# Patient Record
Sex: Female | Born: 1990 | Race: Black or African American | Hispanic: No | Marital: Single | Smoking: Former smoker
Health system: Southern US, Community
[De-identification: ages and names within clinical notes are randomized; demographics above are authoritative.]

## PROBLEM LIST (undated history)

## (undated) DIAGNOSIS — I10 Essential (primary) hypertension: Secondary | ICD-10-CM

## (undated) DIAGNOSIS — I82409 Acute embolism and thrombosis of unspecified deep veins of unspecified lower extremity: Secondary | ICD-10-CM

## (undated) DIAGNOSIS — O169 Unspecified maternal hypertension, unspecified trimester: Secondary | ICD-10-CM

## (undated) HISTORY — PX: ANKLE FRACTURE SURGERY: SHX122

## (undated) HISTORY — PX: DENTAL SURGERY: SHX609

## (undated) HISTORY — PX: DILATION AND CURETTAGE OF UTERUS: SHX78

---
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
## 2010-08-18 LAB — DIFFERENTIAL
Basophils Absolute: 0 10*3/uL (ref 0.0–0.1)
Lymphocytes Relative: 33 % (ref 12–46)
Lymphs Abs: 1.2 10*3/uL (ref 0.7–4.0)
Monocytes Absolute: 0.3 10*3/uL (ref 0.1–1.0)
Neutro Abs: 2.1 10*3/uL (ref 1.7–7.7)
Neutrophils Relative %: 58 % (ref 43–77)

## 2010-08-18 LAB — CBC
HCT: 39.6 % (ref 36.0–46.0)
RBC: 4.59 MIL/uL (ref 3.87–5.11)
RDW: 12.5 % (ref 11.5–15.5)

## 2010-08-18 LAB — URINE MICROSCOPIC-ADD ON

## 2010-08-18 LAB — URINALYSIS, ROUTINE W REFLEX MICROSCOPIC
Ketones, ur: 15 mg/dL — AB
Protein, ur: 30 mg/dL — AB

## 2010-08-18 LAB — BASIC METABOLIC PANEL
CO2: 28 mEq/L (ref 19–32)
Potassium: 3.9 mEq/L (ref 3.5–5.1)
Sodium: 143 mEq/L (ref 135–145)

## 2010-08-18 LAB — PREGNANCY, URINE: Preg Test, Ur: NEGATIVE

## 2011-01-28 ENCOUNTER — Emergency Department (HOSPITAL_BASED_OUTPATIENT_CLINIC_OR_DEPARTMENT_OTHER)
Admission: EM | Admit: 2011-01-28 | Discharge: 2011-01-28 | Disposition: A | Discharge status: Home / Self Care | Payer: Self-pay | Attending: Emergency Medicine | Admitting: Emergency Medicine

## 2011-01-28 ENCOUNTER — Encounter: Payer: Self-pay | Admitting: *Deleted

## 2011-01-28 DIAGNOSIS — R5381 Other malaise: Secondary | ICD-10-CM | POA: Insufficient documentation

## 2011-01-28 DIAGNOSIS — R531 Weakness: Secondary | ICD-10-CM

## 2011-01-28 DIAGNOSIS — R609 Edema, unspecified: Secondary | ICD-10-CM | POA: Insufficient documentation

## 2011-01-28 LAB — URINALYSIS, ROUTINE W REFLEX MICROSCOPIC
Glucose, UA: NEGATIVE mg/dL
Ketones, ur: 15 mg/dL — AB
Nitrite: NEGATIVE
Specific Gravity, Urine: 1.035 — ABNORMAL HIGH (ref 1.005–1.030)

## 2011-01-28 LAB — COMPREHENSIVE METABOLIC PANEL
Albumin: 4 g/dL (ref 3.5–5.2)
Calcium: 9.3 mg/dL (ref 8.4–10.5)
GFR calc non Af Amer: >60 mL/min (ref >60)
Sodium: 140 mEq/L (ref 135–145)
Total Bilirubin: 0.4 mg/dL (ref 0.3–1.2)

## 2011-01-28 LAB — CBC
HCT: 37.2 % (ref 36.0–46.0)
Hemoglobin: 12.9 g/dL (ref 12.0–15.0)
MCH: 28.5 pg (ref 26.0–34.0)
MCHC: 34.7 g/dL (ref 30.0–36.0)
MCV: 82.3 fL (ref 78.0–100.0)
RBC: 4.52 MIL/uL (ref 3.87–5.11)
RDW: 12.5 % (ref 11.5–15.5)

## 2011-01-28 LAB — DIFFERENTIAL
Basophils Absolute: 0 10*3/uL (ref 0.0–0.1)
Eosinophils Absolute: 0 10*3/uL (ref 0.0–0.7)
Neutrophils Relative %: 81 % — ABNORMAL HIGH (ref 43–77)

## 2011-01-28 LAB — PREGNANCY, URINE: Preg Test, Ur: NEGATIVE

## 2011-01-28 MED ORDER — HYDROCHLOROTHIAZIDE 25 MG PO TABS: 12.5000 mg | ORAL_TABLET | ORAL | Status: DC | PRN

## 2011-01-28 MED ORDER — ONDANSETRON 8 MG PO TBDP
8.0000 mg | ORAL_TABLET | Freq: Once | ORAL | Status: AC
  Administered 2011-01-28: 8 mg via ORAL

## 2011-01-28 MED ORDER — IBUPROFEN 200 MG PO TABS
600.0000 mg | ORAL_TABLET | Freq: Once | ORAL | Status: DC
  Filled 2011-01-28: qty 1

## 2011-01-28 MED ORDER — ONDANSETRON 8 MG PO TBDP
ORAL_TABLET | ORAL | Status: AC
  Filled 2011-01-28: qty 1

## 2011-01-28 MED ORDER — KETOROLAC TROMETHAMINE 60 MG/2ML IM SOLN
60.0000 mg | Freq: Once | INTRAMUSCULAR | Status: AC
  Administered 2011-01-28: 60 mg via INTRAMUSCULAR
  Filled 2011-01-28: qty 2

## 2011-01-28 NOTE — ED Notes (Signed)
Pt states that she has noticed her feet swelling. Felt weak at work. "Head spinning". Vaginal bleeding now. Unusual for second month. Spotted before last period.

## 2011-01-28 NOTE — ED Provider Notes (Signed)
Scribed for Dr. Judd Lien, the patient was seen in room 11. This chart was scribed by Hillery Hunter. This patient's care was started at 16:40.   History   CSN: [id number] Arrival date & time: 01/28/2011  4:06 PM  Chief Complaint  Patient presents with  . Weakness   The history is provided by the patient.    Jody Henson is a 20 y.o. female who presents to the Emergency Department complaining of feeling unwell while at work today with generalized weakness, cramping abdominal pain with current menstruation, and feet swelling that waxes and wanes chronically. She has been worked up for the feet swelling without definitive diagnosis and denies any history of trauma to the area. She denies dysuria, frequency, fever, sore throat, cough. She works at [employer] as a Nature conservation officer and reports otherwise normal health history without prior diagnosis or surgery.   History reviewed. No pertinent past medical history.  History reviewed. No pertinent past surgical history.  History reviewed. No pertinent family history.   Review of Systems  Constitutional: Negative for fever.  HENT: Negative for neck stiffness.   Eyes: Negative for visual disturbance.  Respiratory: Negative for cough and shortness of breath.   Cardiovascular: Positive for leg swelling (waxing and waning).       Denies calf pain  Gastrointestinal: Positive for abdominal pain (cramping with menses) and diarrhea (two days ago loose stool). Negative for blood in stool.  Genitourinary: Positive for vaginal bleeding (current menses). Negative for dysuria, frequency, vaginal discharge and difficulty urinating.  Musculoskeletal: Negative for back pain and gait problem.  Skin: Negative for color change.  Neurological: Negative for speech difficulty and headaches.  Psychiatric/Behavioral: Negative for confusion.    Physical Exam  BP 138/93  Pulse 80  Temp(Src) 98.3 F (36.8 C) (Oral)  Resp 18  Ht 5\' 6"  (1.676 m)  Wt 182 lb (82.555  kg)  BMI 29.38 kg/m2  SpO2 100%  LMP 01/28/2011  Physical Exam  Nursing note and vitals reviewed. Constitutional:       Awake, alert, nontoxic appearance with baseline speech for patient.  HENT:  Head: Atraumatic.  Mouth/Throat: No oropharyngeal exudate.  Eyes: Right eye exhibits no discharge. Left eye exhibits no discharge.  Neck: Neck supple.  Cardiovascular: Normal rate and regular rhythm.   No murmur heard. Pulmonary/Chest: Effort normal and breath sounds normal. No stridor. No respiratory distress. She has no wheezes. She has no rales.  Abdominal: Soft. Bowel sounds are normal. She exhibits no mass. There is no tenderness. There is no rebound.  Musculoskeletal: She exhibits no tenderness.       Baseline ROM, moves extremities with no obvious new focal weakness.  Lymphadenopathy:    She has no cervical adenopathy.  Neurological:       Awake, alert, cooperative and aware of situation  Skin: No rash noted.  Psychiatric: She has a normal mood and affect.    ED Course  Procedures   DIAGNOSTIC STUDIES: Oxygen Saturation is 100% on room air, normal by my interpretation.    LABS / RADIOLOGY:  Results for orders placed during the hospital encounter of 01/28/11  URINALYSIS, ROUTINE W REFLEX MICROSCOPIC      Component Value Range   Color, Urine AMBER (*) YELLOW    Appearance TURBID (*) CLEAR    Specific Gravity, Urine 1.035 (*) 1.005 - 1.030    pH 5.0  5.0 - 8.0    Glucose, UA NEGATIVE  NEGATIVE (mg/dL)   Hgb urine dipstick LARGE (*) NEGATIVE  Bilirubin Urine SMALL (*) NEGATIVE    Ketones, ur 15 (*) NEGATIVE (mg/dL)   Protein, ur 161 (*) NEGATIVE (mg/dL)   Urobilinogen, UA 1.0  0.0 - 1.0 (mg/dL)   Nitrite NEGATIVE  NEGATIVE    Leukocytes, UA SMALL (*) NEGATIVE   PREGNANCY, URINE      Component Value Range   Preg Test, Ur NEGATIVE    CBC      Component Value Range   WBC 6.4  4.0 - 10.5 (K/uL)   RBC 4.52  3.87 - 5.11 (MIL/uL)   Hemoglobin 12.9  12.0 - 15.0 (g/dL)    HCT 09.6  04.5 - 40.9 (%)   MCV 82.3  78.0 - 100.0 (fL)   MCH 28.5  26.0 - 34.0 (pg)   MCHC 34.7  30.0 - 36.0 (g/dL)   RDW 81.1  91.4 - 78.2 (%)   Platelets 227  150 - 400 (K/uL)  DIFFERENTIAL      Component Value Range   Neutrophils Relative 81 (*) 43 - 77 (%)   Neutro Abs 5.2  1.7 - 7.7 (K/uL)   Lymphocytes Relative 13  12 - 46 (%)   Lymphs Abs 0.8  0.7 - 4.0 (K/uL)   Monocytes Relative 6  3 - 12 (%)   Monocytes Absolute 0.4  0.1 - 1.0 (K/uL)   Eosinophils Relative 0  0 - 5 (%)   Eosinophils Absolute 0.0  0.0 - 0.7 (K/uL)   Basophils Relative 0  0 - 1 (%)   Basophils Absolute 0.0  0.0 - 0.1 (K/uL)  COMPREHENSIVE METABOLIC PANEL      Component Value Range   Sodium 140  135 - 145 (mEq/L)   Potassium 3.5  3.5 - 5.1 (mEq/L)   Chloride 104  96 - 112 (mEq/L)   CO2 26  19 - 32 (mEq/L)   Glucose, Bld 99  70 - 99 (mg/dL)   BUN 11  6 - 23 (mg/dL)   Creatinine, Ser 9.56  0.50 - 1.10 (mg/dL)   Calcium 9.3  8.4 - 21.3 (mg/dL)   Total Protein 7.9  6.0 - 8.3 (g/dL)   Albumin 4.0  3.5 - 5.2 (g/dL)   AST 17  0 - 37 (U/L)   ALT 10  0 - 35 (U/L)   Alkaline Phosphatase 77  39 - 117 (U/L)   Total Bilirubin 0.4  0.3 - 1.2 (mg/dL)   GFR calc non Af Amer >60  >60 (mL/min)   GFR calc Af Amer >60  >60 (mL/min)  URINE MICROSCOPIC-ADD ON      Component Value Range   Squamous Epithelial / LPF MANY (*) RARE    WBC, UA 11-20  <3 (WBC/hpf)   RBC / HPF TOO NUMEROUS TO COUNT  <3 (RBC/hpf)   Bacteria, UA MANY (*) RARE       ED COURSE / COORDINATION OF CARE: 16:52. Ordered CBC, CMP, U/A, urine pregnancy. 18:00. Patient requesting pain medication. Ordered Ibuprofen 600mg  PO. 18:45. Patient requesting antiemetic and additional pain medication. Ordered Zofran 8mg  ODT and Toradol 60mg  IM.  MDM: Labs okay.  Will discharge with hctz prn, follow up if worsens.   IMPRESSION: Diagnoses that have been ruled out:  Diagnoses that are still under consideration:  Final diagnoses:    PLAN:  Discharge  home I have reviewed the discharge instructions with the patient  CONDITION ON DISCHARGE: Stable  MEDICATIONS GIVEN IN THE E.D.  Medications  ondansetron (ZOFRAN-ODT) 8 MG disintegrating tablet (not administered)  ketorolac (TORADOL)  injection 60 mg (not administered)  ibuprofen (ADVIL,MOTRIN) tablet 600 mg (600 mg Oral Given 01/28/11 1815)  ondansetron (ZOFRAN-ODT) disintegrating tablet 8 mg (8 mg Oral Given 01/28/11 1837)    SCRIBE ATTESTATION: I personally performed the services described in this documentation, which was scribed in my presence. The recorded information has been reviewed and considered. Geoffery Lyons, MD        Geoffery Lyons, MD 01/28/11 1900

## 2011-06-10 ENCOUNTER — Encounter (HOSPITAL_BASED_OUTPATIENT_CLINIC_OR_DEPARTMENT_OTHER): Payer: Self-pay | Admitting: *Deleted

## 2011-06-10 ENCOUNTER — Emergency Department (HOSPITAL_BASED_OUTPATIENT_CLINIC_OR_DEPARTMENT_OTHER)
Admission: EM | Admit: 2011-06-10 | Discharge: 2011-06-10 | Disposition: A | Discharge status: Home / Self Care | Payer: Medicaid Other | Attending: Emergency Medicine | Admitting: Emergency Medicine

## 2011-06-10 DIAGNOSIS — N390 Urinary tract infection, site not specified: Secondary | ICD-10-CM | POA: Insufficient documentation

## 2011-06-10 DIAGNOSIS — B9689 Other specified bacterial agents as the cause of diseases classified elsewhere: Secondary | ICD-10-CM

## 2011-06-10 DIAGNOSIS — O2390 Unspecified genitourinary tract infection in pregnancy, unspecified trimester: Secondary | ICD-10-CM | POA: Insufficient documentation

## 2011-06-10 DIAGNOSIS — O2340 Unspecified infection of urinary tract in pregnancy, unspecified trimester: Secondary | ICD-10-CM

## 2011-06-10 LAB — URINE MICROSCOPIC-ADD ON

## 2011-06-10 LAB — URINALYSIS, ROUTINE W REFLEX MICROSCOPIC
Urobilinogen, UA: 0.2 mg/dL (ref 0.0–1.0)
pH: 7 (ref 5.0–8.0)

## 2011-06-10 LAB — WET PREP, GENITAL

## 2011-06-10 MED ORDER — METRONIDAZOLE 500 MG PO TABS: 500.0000 mg | ORAL_TABLET | Freq: Two times a day (BID) | ORAL | Status: AC

## 2011-06-10 MED ORDER — NITROFURANTOIN MONOHYD MACRO 100 MG PO CAPS: 100.0000 mg | ORAL_CAPSULE | Freq: Two times a day (BID) | ORAL | Status: AC

## 2011-06-10 NOTE — ED Notes (Signed)
Fetal HR detected by ultrasound 145 BPM

## 2011-06-10 NOTE — ED Notes (Signed)
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

## 2011-06-10 NOTE — ED Provider Notes (Signed)
History     CSN: [id number]  Arrival date & time 06/10/11  1044   First MD Initiated Contact with Patient 06/10/11 1204      Chief Complaint  Patient presents with  . Abdominal Cramping    (Consider location/radiation/quality/duration/timing/severity/associated sxs/prior treatment) HPI Comments: Pt states that she had intermittent abdominal cramping with lifting heavy items at work:pt denies any cramping at this time:pt denies fever:pt states that she is supposed to see [facility] ob on Jan 30, but she just got her medicaid so she thinks they will see her soon:pt state states that she has not had any care:pt state that she is [redacted] weeks pregnant and this is her first pregnancy  Patient is a 21 y.o. female presenting with cramps. The history is provided by the patient. No language interpreter was used.  Abdominal Cramping The primary symptoms of the illness include abdominal pain. The primary symptoms of the illness do not include nausea, vomiting, dysuria or vaginal discharge. The current episode started yesterday. The onset of the illness was sudden. The problem has been resolved.  The patient states that she believes she is currently pregnant. The patient has not had a change in bowel habit. Symptoms associated with the illness do not include constipation, frequency or back pain.    History reviewed. No pertinent past medical history.  History reviewed. No pertinent past surgical history.  No family history on file.  History  Substance Use Topics  . Smoking status: Never Smoker   . Smokeless tobacco: Not on file  . Alcohol Use: No    OB History    Grav Para Term Preterm Abortions TAB SAB Ect Mult Living   1 0 0  0           Review of Systems  Gastrointestinal: Positive for abdominal pain. Negative for nausea, vomiting and constipation.  Genitourinary: Negative for dysuria, frequency and vaginal discharge.  Musculoskeletal: Negative for back pain.  All other systems reviewed and  are negative.    Allergies  Review of patient's allergies indicates no known allergies.  Home Medications   Current Outpatient Rx  Name Route Sig Dispense Refill  . HYDROCHLOROTHIAZIDE 25 MG PO TABS Oral Take 0.5 tablets (12.5 mg total) by mouth as needed. 15 tablet 0    BP 115/66  Pulse 80  Temp(Src) 97.6 F (36.4 C) (Oral)  Resp 18  SpO2 100%  LMP 01/28/2011  Physical Exam  Nursing note and vitals reviewed. Constitutional: She is oriented to person, place, and time. She appears well-developed and well-nourished.  HENT:  Head: Normocephalic and atraumatic.  Cardiovascular: Normal rate and regular rhythm.   Pulmonary/Chest: Effort normal and breath sounds normal.  Abdominal: Soft. There is no tenderness.       gravid  Genitourinary:       White vaginal discharge:no cmt  Musculoskeletal: Normal range of motion.  Neurological: She is alert and oriented to person, place, and time.  Skin: Skin is warm and dry.  Psychiatric: She has a normal mood and affect.    ED Course  Procedures (including critical care time)  Labs Reviewed  URINALYSIS, ROUTINE W REFLEX MICROSCOPIC - Abnormal; Notable for the following:    APPearance CLOUDY (*)    Leukocytes, UA LARGE (*)    All other components within normal limits  URINE MICROSCOPIC-ADD ON - Abnormal; Notable for the following:    Squamous Epithelial / LPF MANY (*)    Bacteria, UA MANY (*)    All other components  within normal limits  WET PREP, GENITAL - Abnormal; Notable for the following:    Clue Cells, Wet Prep MANY (*)    WBC, Wet Prep HPF POC TOO NUMEROUS TO COUNT (*)    All other components within normal limits  PREGNANCY, URINE  GC/CHLAMYDIA PROBE AMP, GENITAL   No results found.   1. UTI in pregnancy   2. BV (bacterial vaginosis)       MDM  Pt fht about 145:abdomen is benign:will treat for uti and pt can follow up with AO:ZHYQ note pt to have lifting restriction        Teressa Lower, NP 06/10/11  1333

## 2011-06-10 NOTE — ED Provider Notes (Signed)
Medical screening examination/treatment/procedure(s) were performed by non-physician practitioner and as supervising physician I was immediately available for consultation/collaboration.  Ethelda Chick, MD 06/10/11 1343

## 2011-06-10 NOTE — ED Notes (Signed)
No cramping or abd pain at this time, abd soft, non-tender, fetal heart beat present

## 2011-06-13 LAB — GC/CHLAMYDIA PROBE AMP, GENITAL
Chlamydia, DNA Probe: POSITIVE — AB
GC Probe Amp, Genital: NEGATIVE

## 2011-06-14 NOTE — ED Notes (Signed)
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 Chart sent to EDP office for review.

## 2011-06-15 ENCOUNTER — Telehealth (HOSPITAL_COMMUNITY): Payer: Self-pay | Admitting: *Deleted

## 2011-06-15 NOTE — ED Notes (Signed)
RX for Doxycycline 100mg  BID 7 days  Written by Trixie Dredge Needs to be called  To pharmacy

## 2011-06-16 ENCOUNTER — Telehealth (HOSPITAL_COMMUNITY): Payer: Self-pay | Admitting: *Deleted

## 2011-06-17 ENCOUNTER — Telehealth (HOSPITAL_COMMUNITY): Payer: Self-pay | Admitting: Emergency Medicine

## 2011-06-17 NOTE — ED Notes (Addendum)
Several attempts made to contact patient; will send letter to Epic address

## 2011-06-19 NOTE — ED Notes (Signed)
Sent letter to Epic address - 1/21

## 2011-06-30 NOTE — ED Notes (Addendum)
Rx called to [facility] [phone]. Patient called back  And requests something in Liquid form.

## 2011-07-01 NOTE — ED Notes (Signed)
Patient contacted regarding new RX. RX for Amoxicillin 250mg /71ml, 10 ml PO TID x seven days called to [facility].

## 2011-07-17 ENCOUNTER — Encounter (HOSPITAL_BASED_OUTPATIENT_CLINIC_OR_DEPARTMENT_OTHER): Payer: Self-pay | Admitting: *Deleted

## 2011-07-17 ENCOUNTER — Emergency Department (INDEPENDENT_AMBULATORY_CARE_PROVIDER_SITE_OTHER): Payer: Medicaid Other

## 2011-07-17 ENCOUNTER — Emergency Department (HOSPITAL_BASED_OUTPATIENT_CLINIC_OR_DEPARTMENT_OTHER)
Admission: EM | Admit: 2011-07-17 | Discharge: 2011-07-17 | Disposition: A | Discharge status: Home Health | Payer: Medicaid Other | Attending: Emergency Medicine | Admitting: Emergency Medicine

## 2011-07-17 DIAGNOSIS — R600 Localized edema: Secondary | ICD-10-CM

## 2011-07-17 DIAGNOSIS — O2690 Pregnancy related conditions, unspecified, unspecified trimester: Secondary | ICD-10-CM | POA: Insufficient documentation

## 2011-07-17 DIAGNOSIS — M7989 Other specified soft tissue disorders: Secondary | ICD-10-CM

## 2011-07-17 DIAGNOSIS — R609 Edema, unspecified: Secondary | ICD-10-CM | POA: Insufficient documentation

## 2011-07-17 DIAGNOSIS — R209 Unspecified disturbances of skin sensation: Secondary | ICD-10-CM | POA: Insufficient documentation

## 2011-07-17 DIAGNOSIS — Z331 Pregnant state, incidental: Secondary | ICD-10-CM

## 2011-07-17 MED ORDER — OXYCODONE-ACETAMINOPHEN 5-325 MG PO TABS: 2.0000 | ORAL_TABLET | ORAL | Status: AC | PRN

## 2011-07-17 NOTE — Discharge Instructions (Signed)
Jody Henson you do not have a blood clot in your lower extremities.  Follow up with your [facility] GYN physician as scheduled.  See if you can get the appointment moved up.   Keep your Lower extremities elevated above you heart for the next 24 hours.  Let your doctor know your edema has gotten worse. You may have carpel tunnel in your LUE.  Follow up with the orthopedic listed for that.    Edema Edema is a buildup of fluids. It is most common in the feet, ankles, and legs. This happens more as a person ages. It may affect one or both legs. HOME CARE   Raise (elevate) the legs or ankles above the level of the heart while lying down.   Avoid sitting or standing still for a long time.   Exercise the legs to help the puffiness (swelling) go down.   A low-salt diet may help lessen the puffiness.   Only take medicine as told by your doctor.  GET HELP RIGHT AWAY IF:   You develop shortness of breath or chest pain.   You cannot breathe when you lie down.   You have more puffiness that does not go away with treatment.   You develop pain or redness in the areas that are puffy.   You have a temperature by mouth above 102 F (38.9 C), not controlled by medicine.   You gain 3 lb/1.4 kg or more in 1 day or 5 lb/2.3 kg in a week.  MAKE SURE YOU:   Understand these instructions.   Will watch your condition.   Will get help right away if you are not doing well or get worse.  Document Released: 11/01/2007 Document Revised: 01/25/2011 Document Reviewed: 11/01/2007 [facility] Patient Information 2012 [place], [place].Edema Edema is a buildup of fluids. It is most common in the feet, ankles, and legs. This happens more as a person ages. It may affect one or both legs. HOME CARE   Raise (elevate) the legs or ankles above the level of the heart while lying down.   Avoid sitting or standing still for a long time.   Exercise the legs to help the puffiness (swelling) go down.   A low-salt diet may help  lessen the puffiness.   Only take medicine as told by your doctor.  GET HELP RIGHT AWAY IF:   You develop shortness of breath or chest pain.   You cannot breathe when you lie down.   You have more puffiness that does not go away with treatment.   You develop pain or redness in the areas that are puffy.   You have a temperature by mouth above 102 F (38.9 C), not controlled by medicine.   You gain 3 lb/1.4 kg or more in 1 day or 5 lb/2.3 kg in a week.  MAKE SURE YOU:   Understand these instructions.   Will watch your condition.   Will get help right away if you are not doing well or get worse.  Document Released: 11/01/2007 Document Revised: 01/25/2011 Document Reviewed: 11/01/2007 [facility] Patient Information 2012 [place], [place].

## 2011-07-17 NOTE — ED Provider Notes (Signed)
Medical screening examination/treatment/procedure(s) were performed by non-physician practitioner and as supervising physician I was immediately available for consultation/collaboration.   Elnathan Fulford A. Sibel Khurana, MD 07/17/11 2351 

## 2011-07-17 NOTE — ED Notes (Signed)
Pt. Is in no distress and will be going to have a venous doppler done on L and R.

## 2011-07-17 NOTE — ED Notes (Signed)
Pt. Reporting she has noted L hand pain on the palm side.  Pt. Also reports she has L and R leg pain from the Foot to the Mid calf.  Pt. Is in no distress and has no s/s of shortness of breath.  Pt. Reports she has pain after standing for a long period of time in the R and L leg.  Noted pitting edema in the R and L leg and R and L feet.  No s/s of wheeping edema and no discoloration of the R or L leg.

## 2011-07-17 NOTE — ED Notes (Signed)
Pt. Is able to walk steady gait to room

## 2011-07-17 NOTE — ED Notes (Signed)
Ankles swelling. Pain in her wrist. She is [redacted] weeks pregnant.

## 2011-07-17 NOTE — ED Provider Notes (Signed)
History     CSN: [id number]  Arrival date & time 07/17/11  1606   First MD Initiated Contact with Patient 07/17/11 1622      Chief Complaint  Patient presents with  . Leg Swelling    (Consider location/radiation/quality/duration/timing/severity/associated sxs/prior treatment) Patient is a 21 y.o. female presenting with extremity pain. The history is provided by the patient. No language interpreter was used.  Extremity Pain This is a chronic problem. The current episode started in the past 7 days. The problem occurs daily. The problem has been gradually worsening. Associated symptoms include numbness. Pertinent negatives include no abdominal pain, chest pain, chills, congestion, coughing, fever, joint swelling, nausea, sore throat, swollen glands, urinary symptoms, vomiting or weakness. The symptoms are aggravated by walking. She has tried nothing for the symptoms.   21 year old [redacted] week pregnant female patient coming in with bilateral lower extremity 2+ pitting edema and calf pain bilateral. States that the pitting edema started over a week ago and the calf pain started 2 days ago.   She goes to  [facility]. Next appointment is on the 25th. Also having left upper extremity hand forearm pain tingleing and numbness x1 week. States that the pain in her hand wrist wakes her in the middle of the night and  started 2 days ago.Denies SOB or chest pain.  States that she works 4-8 hours a week and she is on her feet.  She has been elevating her lower extremities with no relief.  States that when she walks she has calf pain in bilateral LE. Denies abdominal pain, vaginal bleeding or discharge today.   History reviewed. No pertinent past medical history.  History reviewed. No pertinent past surgical history.  No family history on file.  History  Substance Use Topics  . Smoking status: Never Smoker   . Smokeless tobacco: Not on file  . Alcohol Use: No    OB History    Grav Para Term  Preterm Abortions TAB SAB Ect Mult Living   1 0 0  0           Review of Systems  Constitutional: Negative for fever and chills.  HENT: Negative for congestion and sore throat.   Respiratory: Negative for cough.   Cardiovascular: Negative for chest pain.  Gastrointestinal: Positive for abdominal distention. Negative for nausea, vomiting and abdominal pain.  Genitourinary: Negative for dysuria, urgency, frequency, hematuria, flank pain, vaginal bleeding, vaginal discharge, difficulty urinating, vaginal pain and pelvic pain.  Musculoskeletal: Negative for joint swelling.  Neurological: Positive for numbness. Negative for dizziness, seizures, syncope, weakness and light-headedness.       Intermittant LUE numbness  All other systems reviewed and are negative.    Allergies  Review of patient's allergies indicates no known allergies.  Home Medications  No current outpatient prescriptions on file.  BP 130/72  Pulse 90  Temp(Src) 97.5 F (36.4 C) (Oral)  Resp 18  Ht 5\' 6"  (1.676 m)  Wt 201 lb (91.173 kg)  BMI 32.44 kg/m2  SpO2 100%  LMP 01/28/2011  Physical Exam  Nursing note and vitals reviewed. Constitutional: She is oriented to person, place, and time. She appears well-developed and well-nourished.  HENT:  Head: Normocephalic and atraumatic.  Eyes: Conjunctivae and EOM are normal. Pupils are equal, round, and reactive to light.  Neck: Normal range of motion. Neck supple.  Cardiovascular: Normal rate, regular rhythm, normal heart sounds and intact distal pulses.   Pulmonary/Chest: Effort normal and breath sounds normal.  Abdominal: Soft.  Bowel sounds are normal. She exhibits distension.       [redacted] weeks pregnant  Musculoskeletal: Normal range of motion. She exhibits edema and tenderness.       LE 2+ pitting edema  Neurological: She is alert and oriented to person, place, and time. She has normal reflexes. No cranial nerve deficit. Coordination normal.  Skin: Skin is warm and  dry.  Psychiatric: She has a normal mood and affect.    ED Course  Procedures (including critical care time)  Labs Reviewed - No data to display No results found.   No diagnosis found.    MDM  21 year old pregnant female coming in today with bilateral lower extremity edema and calf pain with negative u/s for dvt.  No SOB.  States she also had left upper extremity pain that woke her up she was sleeping the past 2 nights. She's having some numbness in the left hand like carpeltunnel as well. Instructed to move OB GYN appointment from the 25th of Feb to this week..  Follow up with ortho for the carpel tunnel.            Jethro Bastos, NP 07/17/11 1935

## 2013-08-27 ENCOUNTER — Emergency Department (HOSPITAL_BASED_OUTPATIENT_CLINIC_OR_DEPARTMENT_OTHER)
Admission: EM | Admit: 2013-08-27 | Discharge: 2013-08-27 | Disposition: A | Discharge status: Home / Self Care | Payer: Medicaid Other | Attending: Emergency Medicine | Admitting: Emergency Medicine

## 2013-08-27 ENCOUNTER — Encounter (HOSPITAL_BASED_OUTPATIENT_CLINIC_OR_DEPARTMENT_OTHER): Payer: Self-pay | Admitting: Emergency Medicine

## 2013-08-27 DIAGNOSIS — R609 Edema, unspecified: Secondary | ICD-10-CM | POA: Insufficient documentation

## 2013-08-27 DIAGNOSIS — R600 Localized edema: Secondary | ICD-10-CM

## 2013-08-27 MED ORDER — FUROSEMIDE 20 MG PO TABS: 20.0000 mg | ORAL_TABLET | Freq: Every day | ORAL | Status: DC

## 2013-08-27 NOTE — ED Provider Notes (Signed)
CSN: [id number]     Arrival date & time 08/27/13  1407 History   First MD Initiated Contact with Patient 08/27/13 1534     Chief Complaint  Patient presents with  . Leg Swelling     (Consider location/radiation/quality/duration/timing/severity/associated sxs/prior Treatment) Patient is a 23 y.o. female presenting with leg pain.  Leg Pain Location:  Ankle and foot Injury: no   Ankle location:  L ankle and R ankle Foot location:  L foot and R foot Pain details:    Quality:  Aching   Radiates to:  Does not radiate   Severity:  No pain   Onset quality:  Gradual   Timing:  Constant Chronicity:  Recurrent Worsened by:  Nothing tried Ineffective treatments:  None tried   No past medical history on file. No past surgical history on file. No family history on file. History  Substance Use Topics  . Smoking status: Never Smoker   . Smokeless tobacco: Not on file  . Alcohol Use: No   OB History   Grav Para Term Preterm Abortions TAB SAB Ect Mult Living   1 0 0  0          Review of Systems  Musculoskeletal: Positive for joint swelling.  All other systems reviewed and are negative.      Allergies  Review of patient's allergies indicates no known allergies.  Home Medications   Current Outpatient Rx  Name  Route  Sig  Dispense  Refill  . Multiple Vitamins-Minerals (MULTIVITAMINS THER. W/MINERALS) TABS   Oral   Take 1 tablet by mouth daily.          BP 120/74  Temp(Src) 98.3 F (36.8 C) (Oral)  Resp 16  Ht 5\' 6"  (1.676 m)  Wt 210 lb (95.255 kg)  BMI 33.91 kg/m2  SpO2 100%  LMP 08/10/2013  Breastfeeding? Unknown Physical Exam  Nursing note and vitals reviewed. Constitutional: She appears well-developed and well-nourished.  HENT:  Head: Normocephalic.  Eyes: Pupils are equal, round, and reactive to light.  Neck: Normal range of motion.  Cardiovascular: Normal rate, regular rhythm and normal heart sounds.   Pulmonary/Chest: Effort normal.  Abdominal:  Soft.  Musculoskeletal: She exhibits edema.  Swelling bilat feet,  Good pulses,    Skin: Skin is warm.    ED Course  Procedures (including critical care time) Labs Review Labs Reviewed - No data to display Imaging Review No results found.   EKG Interpretation None      MDM no dvt risk.      Final diagnoses:  Edema extremities    Pt has had similar in the past.  Some relief with lasix.  Pt advised to elevate feet, avoid salt.  Rx for lasix   Elson AreasLeslie K Sofia, PA-C 08/27/13 1614  Lonia SkinnerLeslie K LouisburgSofia, [place] JerseyPA-C 08/27/13 1614

## 2013-08-27 NOTE — ED Notes (Signed)
Pt reports swelling in bilateral legs, with pain in left leg x3days. Pt reports of same problem without a known diagnosis. No other complaints.

## 2013-08-27 NOTE — ED Provider Notes (Signed)
Medical screening examination/treatment/procedure(s) were performed by non-physician practitioner and as supervising physician I was immediately available for consultation/collaboration.   EKG Interpretation None        Gwyneth SproutWhitney Odysseus Cada, MD 08/27/13 2352

## 2013-08-27 NOTE — Discharge Instructions (Signed)

## 2014-02-14 IMAGING — US US EXTREM LOW BILAT COMP
1 series · 14 of 25 positions shown · non-contrast
Comparison: None.

CLINICAL DATA: Bilateral leg swelling.  Pregnant

BILATERAL LOWER EXTREMITY VENOUS DUPLEX ULTRASOUND
TECHNIQUE: Gray-scale sonography with graded compression, as well
as color Doppler and duplex ultrasound, were performed to evaluate
the deep venous system of both lower extremities from the level of
the common femoral vein through the popliteal and proximal calf
veins.  Spectral Doppler was utilized to evaluate flow at rest and
with distal augmentation maneuvers.

[Series 1: us extrem low bilat comp · 14 of 50 slices shown]
[im 1/50]
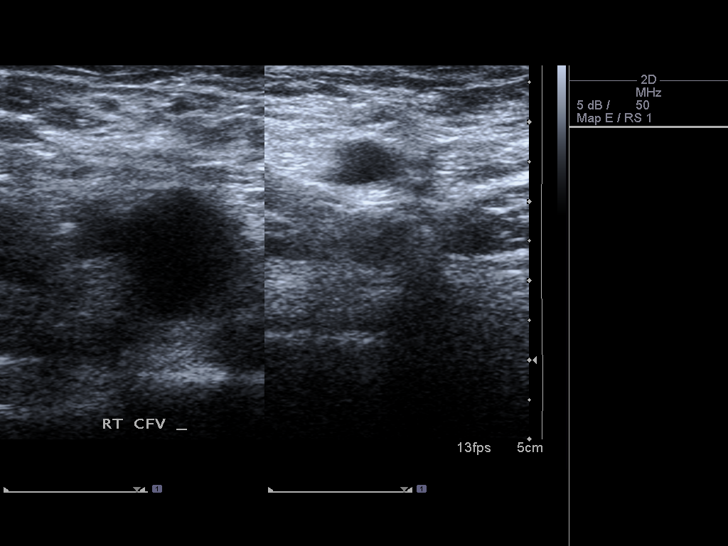
[im 5/50]
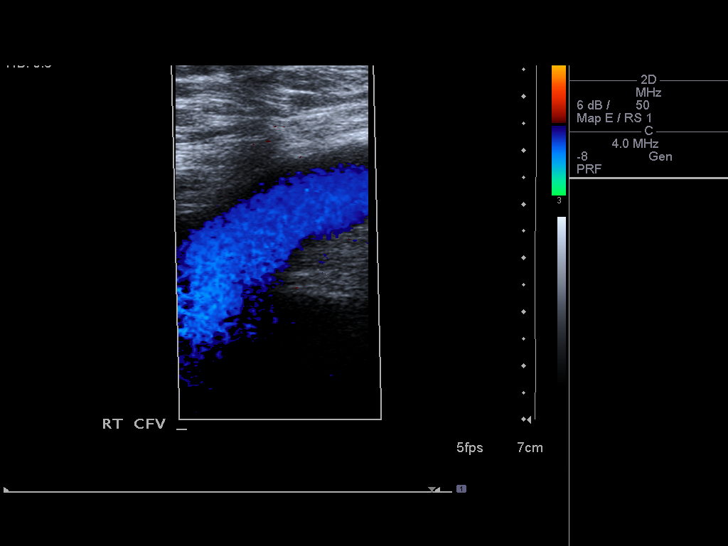
[im 9/50]
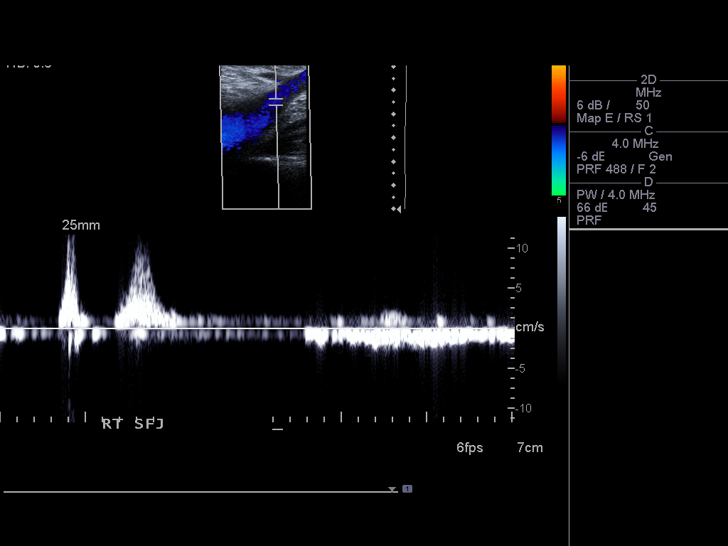
[im 13/50]
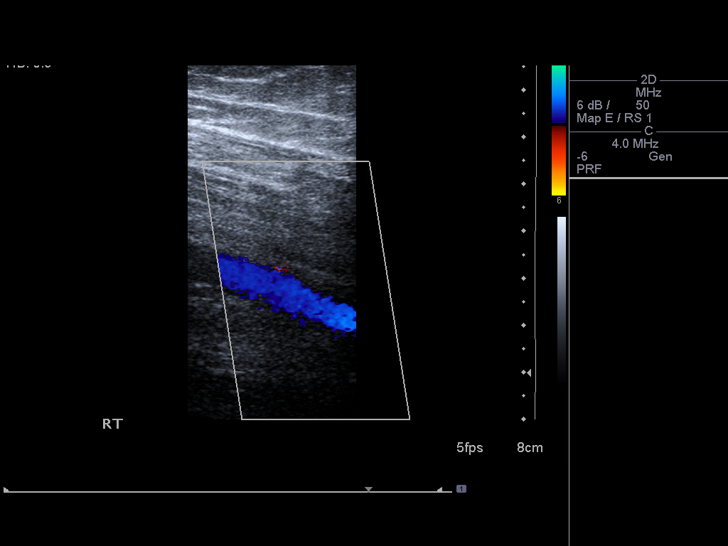
[im 17/50]
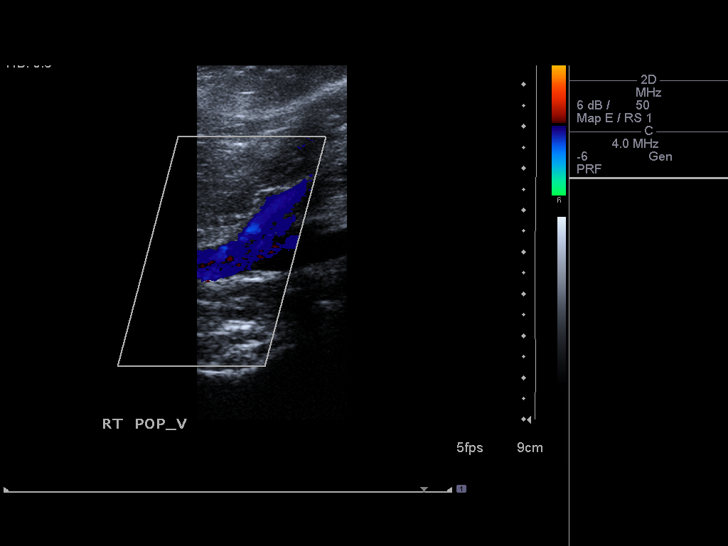
[im 19/50]
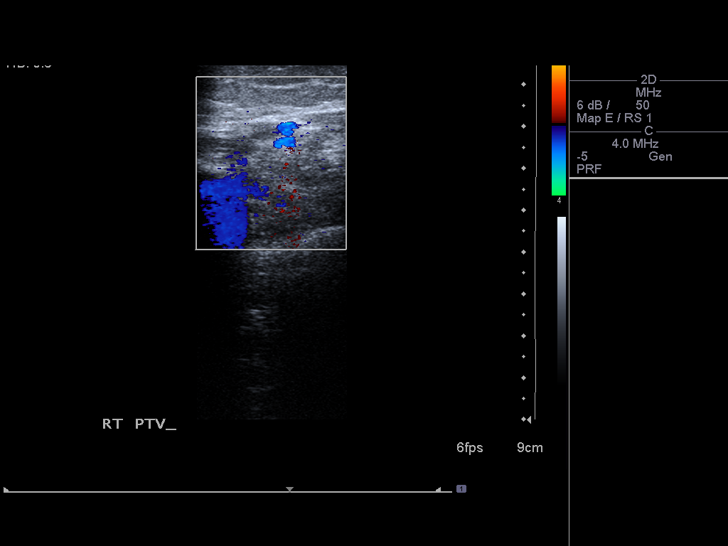
[im 23/50]
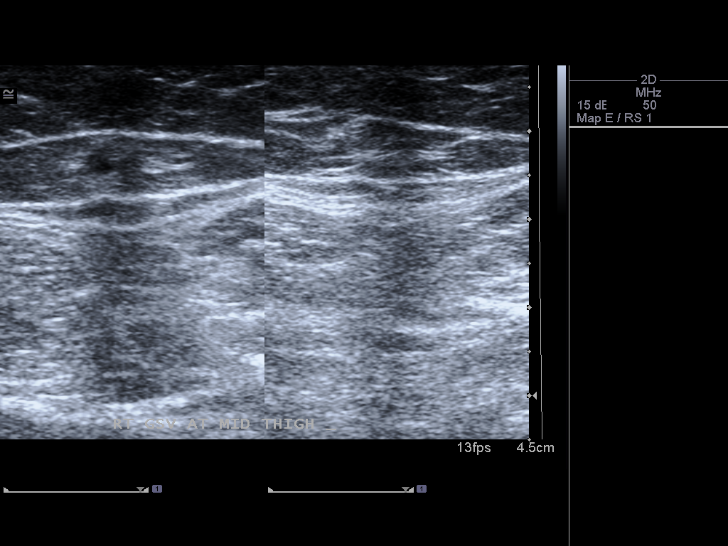
[im 27/50]
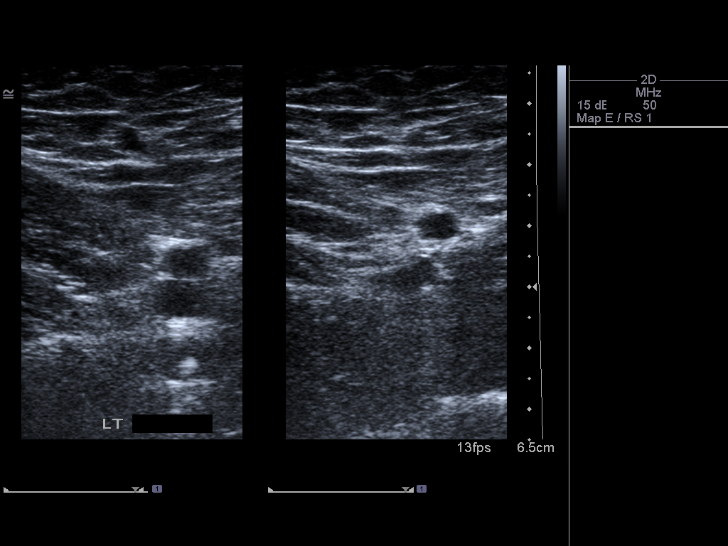
[im 31/50]
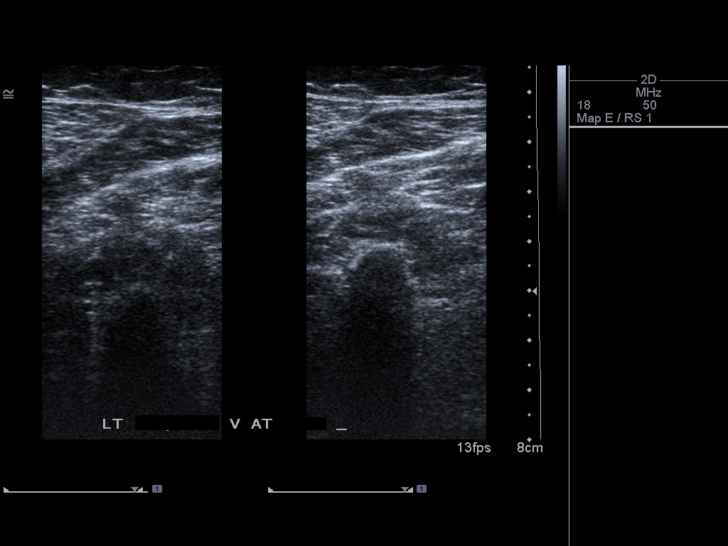
[im 33/50]
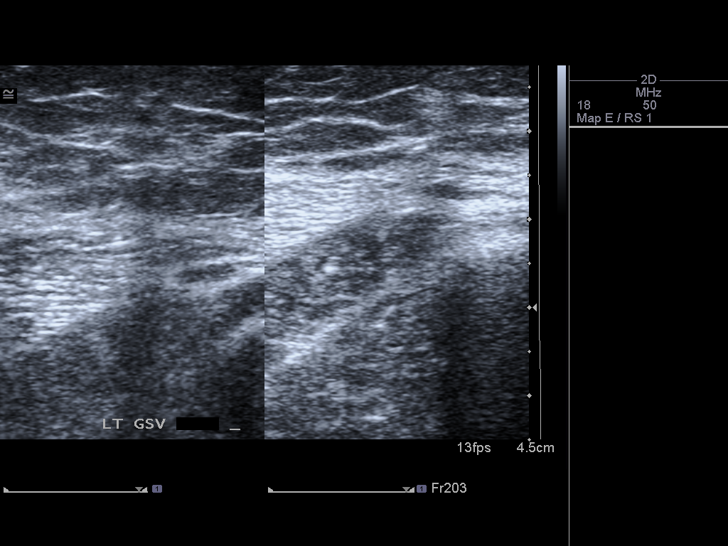
[im 37/50]
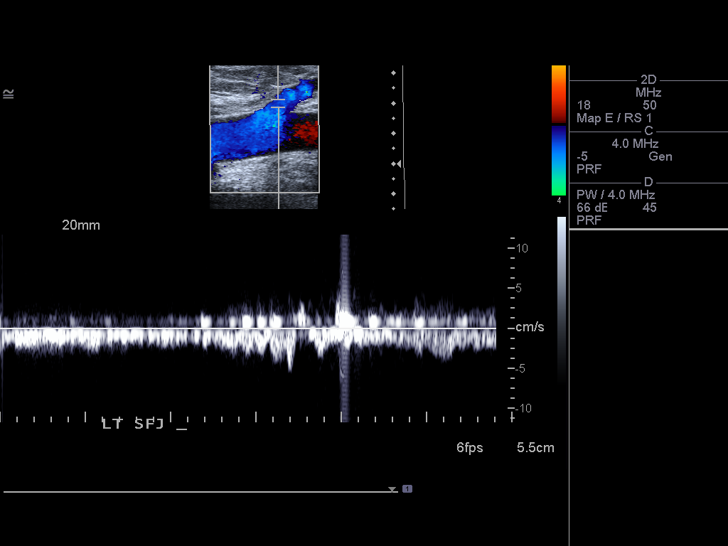
[im 41/50]
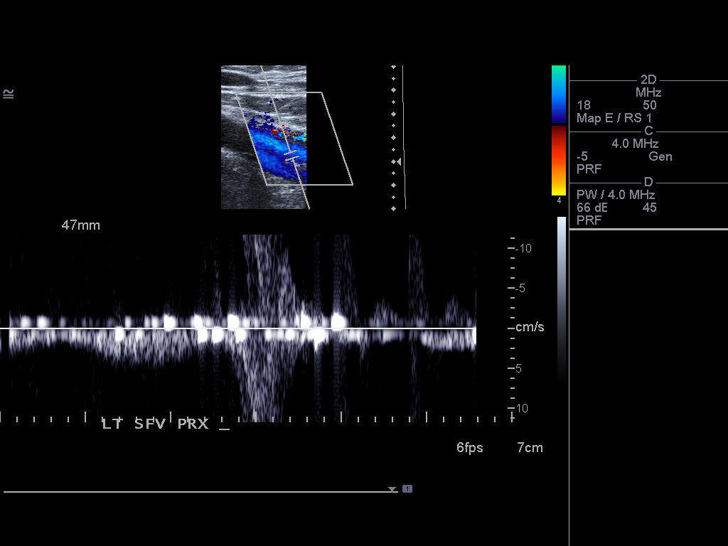
[im 45/50]
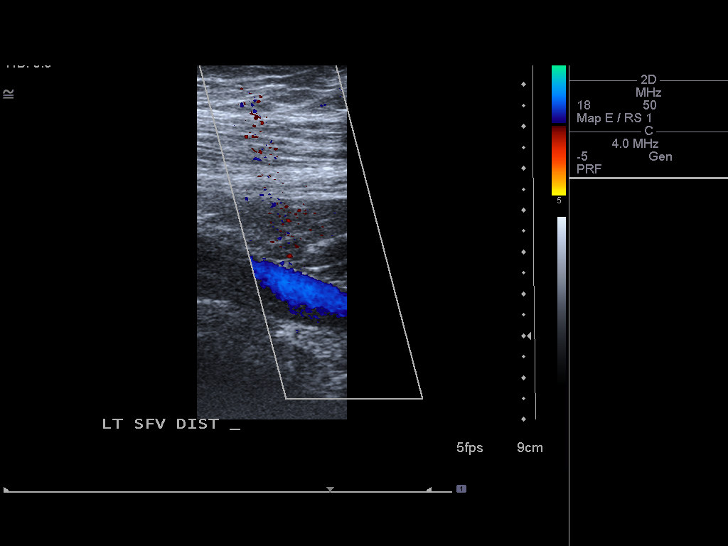
[im 50/50]
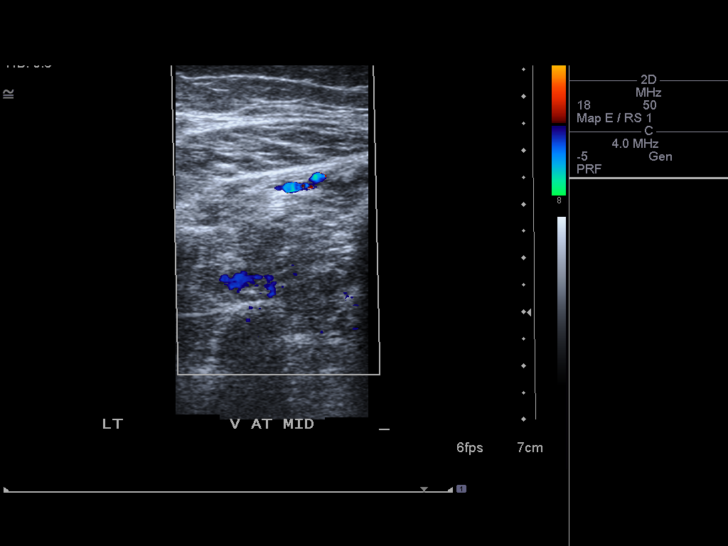

[14 of 25 positions shown; findings below may reference images not displayed]

FINDINGS: Normal compressibility of bilateral common femoral,
superficial femoral, and popliteal veins is demonstrated, as well
as the visualized proximal calf veins.  No filling defects to
suggest DVT on grayscale or color Doppler imaging.  Doppler
waveforms show normal direction of venous flow, normal respiratory
phasicity and response to augmentation.
IMPRESSION: No evidence of deep vein thrombosis in either lower extremity.

## 2014-03-12 ENCOUNTER — Emergency Department (HOSPITAL_BASED_OUTPATIENT_CLINIC_OR_DEPARTMENT_OTHER)
Admission: EM | Admit: 2014-03-12 | Discharge: 2014-03-12 | Disposition: A | Discharge status: Home / Self Care | Payer: Medicaid Other | Attending: Emergency Medicine | Admitting: Emergency Medicine

## 2014-03-12 ENCOUNTER — Encounter (HOSPITAL_BASED_OUTPATIENT_CLINIC_OR_DEPARTMENT_OTHER): Payer: Self-pay | Admitting: Emergency Medicine

## 2014-03-12 DIAGNOSIS — B9689 Other specified bacterial agents as the cause of diseases classified elsewhere: Secondary | ICD-10-CM

## 2014-03-12 DIAGNOSIS — N760 Acute vaginitis: Secondary | ICD-10-CM | POA: Insufficient documentation

## 2014-03-12 DIAGNOSIS — Z79899 Other long term (current) drug therapy: Secondary | ICD-10-CM | POA: Insufficient documentation

## 2014-03-12 DIAGNOSIS — Z3202 Encounter for pregnancy test, result negative: Secondary | ICD-10-CM | POA: Insufficient documentation

## 2014-03-12 LAB — URINALYSIS, ROUTINE W REFLEX MICROSCOPIC
BILIRUBIN URINE: NEGATIVE
Glucose, UA: NEGATIVE mg/dL
Hgb urine dipstick: NEGATIVE
KETONES UR: NEGATIVE mg/dL
LEUKOCYTES UA: NEGATIVE
NITRITE: NEGATIVE
PROTEIN: NEGATIVE mg/dL
Specific Gravity, Urine: 1.033 — ABNORMAL HIGH (ref 1.005–1.030)
UROBILINOGEN UA: 0.2 mg/dL (ref 0.0–1.0)
pH: 5.5 (ref 5.0–8.0)

## 2014-03-12 LAB — WET PREP, GENITAL
Trich, Wet Prep: NONE SEEN
Yeast Wet Prep HPF POC: NONE SEEN

## 2014-03-12 LAB — PREGNANCY, URINE: Preg Test, Ur: NEGATIVE

## 2014-03-12 MED ORDER — METRONIDAZOLE 500 MG PO TABS: 500.0000 mg | ORAL_TABLET | Freq: Two times a day (BID) | ORAL | Status: DC

## 2014-03-12 NOTE — ED Notes (Signed)
Lower back, abd pain x 1-2 weeks and vaginal d/c yeterday

## 2014-03-12 NOTE — ED Provider Notes (Signed)
CSN: [id number]     Arrival date & time 03/12/14  1416 History   First MD Initiated Contact with Patient 03/12/14 1434     Chief Complaint  Patient presents with  . Abdominal Pain     (Consider location/radiation/quality/duration/timing/severity/associated sxs/prior Treatment) HPI Comments: Pt c/o lower back pain and lower abdominal cramping and urinary frequency times 1 week. Pt states that she noticed discharge with odor yesterday. Has history of std. No fever, vomiting or diarrhea.  The history is provided by the patient. No language interpreter was used.    History reviewed. No pertinent past medical history. History reviewed. No pertinent past surgical history. No family history on file. History  Substance Use Topics  . Smoking status: Never Smoker   . Smokeless tobacco: Not on file  . Alcohol Use: No   OB History   Grav Para Term Preterm Abortions TAB SAB Ect Mult Living   1 0 0  0          Review of Systems  All other systems reviewed and are negative.     Allergies  Review of patient's allergies indicates no known allergies.  Home Medications   Prior to Admission medications   Medication Sig Start Date End Date Taking? Authorizing Provider  furosemide (LASIX) 20 MG tablet Take 1 tablet (20 mg total) by mouth daily. 08/27/13   Elson AreasLeslie K Sofia, PA-C  Multiple Vitamins-Minerals (MULTIVITAMINS THER. W/MINERALS) TABS Take 1 tablet by mouth daily.    Historical Provider, MD   BP 143/73  Pulse 75  Temp(Src) 99.5 F (37.5 C) (Oral)  Resp 16  Ht 5\' 5"  (1.651 m)  Wt 204 lb (92.534 kg)  BMI 33.95 kg/m2  SpO2 100%  LMP 02/17/2014 Physical Exam  Nursing note and vitals reviewed. Constitutional: She is oriented to person, place, and time. She appears well-developed and well-nourished.  HENT:  Head: Normocephalic and atraumatic.  Cardiovascular: Regular rhythm.   Pulmonary/Chest: Effort normal and breath sounds normal.  Abdominal: Soft. Bowel sounds are normal.  There is no tenderness.  Genitourinary:  malodorous white discharge. -cmt  Musculoskeletal: Normal range of motion.  Neurological: She is alert and oriented to person, place, and time.  Skin: Skin is warm.  Psychiatric: She has a normal mood and affect.    ED Course  Procedures (including critical care time) Labs Review Labs Reviewed  WET PREP, GENITAL - Abnormal; Notable for the following:    Clue Cells Wet Prep HPF POC FEW (*)    WBC, Wet Prep HPF POC FEW (*)    All other components within normal limits  URINALYSIS, ROUTINE W REFLEX MICROSCOPIC - Abnormal; Notable for the following:    Specific Gravity, Urine 1.033 (*)    All other components within normal limits  GC/CHLAMYDIA PROBE AMP  PREGNANCY, URINE    Imaging Review No results found.   EKG Interpretation None      MDM   Final diagnoses:  BV (bacterial vaginosis)    Will treat with flagyl:exam not consistent with pid    Teressa LowerVrinda Zahir Eisenhour, NP 03/12/14 1538

## 2014-03-12 NOTE — Discharge Instructions (Signed)
Bacterial Vaginosis Bacterial vaginosis is a vaginal infection that occurs when the normal balance of bacteria in the vagina is disrupted. It results from an overgrowth of certain bacteria. This is the most common vaginal infection in women of childbearing age. Treatment is important to prevent complications, especially in pregnant women, as it can cause a premature delivery. CAUSES  Bacterial vaginosis is caused by an increase in harmful bacteria that are normally present in smaller amounts in the vagina. Several different kinds of bacteria can cause bacterial vaginosis. However, the reason that the condition develops is not fully understood. RISK FACTORS Certain activities or behaviors can put you at an increased risk of developing bacterial vaginosis, including:  Having a new sex partner or multiple sex partners.  Douching.  Using an intrauterine device (IUD) for contraception. Women do not get bacterial vaginosis from toilet seats, bedding, swimming pools, or contact with objects around them. SIGNS AND SYMPTOMS  Some women with bacterial vaginosis have no signs or symptoms. Common symptoms include:  Grey vaginal discharge.  A fishlike odor with discharge, especially after sexual intercourse.  Itching or burning of the vagina and vulva.  Burning or pain with urination. DIAGNOSIS  Your health care provider will take a medical history and examine the vagina for signs of bacterial vaginosis. A sample of vaginal fluid may be taken. Your health care provider will look at this sample under a microscope to check for bacteria and abnormal cells. A vaginal pH test may also be done.  TREATMENT  Bacterial vaginosis may be treated with antibiotic medicines. These may be given in the form of a pill or a vaginal cream. A second round of antibiotics may be prescribed if the condition comes back after treatment.  HOME CARE INSTRUCTIONS   Only take over-the-counter or prescription medicines as  directed by your health care provider.  If antibiotic medicine was prescribed, take it as directed. Make sure you finish it even if you start to feel better.  Do not have sex until treatment is completed.  Tell all sexual partners that you have a vaginal infection. They should see their health care provider and be treated if they have problems, such as a mild rash or itching.  Practice safe sex by using condoms and only having one sex partner. SEEK MEDICAL CARE IF:   Your symptoms are not improving after 3 days of treatment.  You have increased discharge or pain.  You have a fever. MAKE SURE YOU:   Understand these instructions.  Will watch your condition.  Will get help right away if you are not doing well or get worse. FOR MORE INFORMATION  Centers for Disease Control and Prevention, Division of STD Prevention: www.cdc.gov/std American Sexual Health Association (ASHA): www.ashastd.org  Document Released: 05/15/2005 Document Revised: 03/05/2013 Document Reviewed: 12/25/2012 Exit[employer] Patient Information 2015 [employer]. This information is not intended to replace advice given to you by your health care provider. Make sure you discuss any questions you have with your health care provider.  

## 2014-03-12 NOTE — ED Notes (Signed)
Pt states she has had bv in the past, and had these same sx.

## 2014-03-13 LAB — GC/CHLAMYDIA PROBE AMP
CT Probe RNA: NEGATIVE
GC PROBE AMP APTIMA: NEGATIVE

## 2014-03-13 NOTE — ED Provider Notes (Signed)
Medical screening examination/treatment/procedure(s) were performed by non-physician practitioner and as supervising physician I was immediately available for consultation/collaboration.   EKG Interpretation None        Moranda Billiot, MD 03/13/14 0702 

## 2014-03-30 ENCOUNTER — Encounter (HOSPITAL_BASED_OUTPATIENT_CLINIC_OR_DEPARTMENT_OTHER): Payer: Self-pay | Admitting: Emergency Medicine

## 2014-10-12 ENCOUNTER — Emergency Department (HOSPITAL_BASED_OUTPATIENT_CLINIC_OR_DEPARTMENT_OTHER)
Admission: EM | Admit: 2014-10-12 | Discharge: 2014-10-13 | Disposition: A | Discharge status: Home / Self Care | Payer: Medicaid Other | Attending: Emergency Medicine | Admitting: Emergency Medicine

## 2014-10-12 ENCOUNTER — Encounter (HOSPITAL_BASED_OUTPATIENT_CLINIC_OR_DEPARTMENT_OTHER): Payer: Self-pay | Admitting: *Deleted

## 2014-10-12 DIAGNOSIS — R2243 Localized swelling, mass and lump, lower limb, bilateral: Secondary | ICD-10-CM | POA: Insufficient documentation

## 2014-10-12 DIAGNOSIS — Z79899 Other long term (current) drug therapy: Secondary | ICD-10-CM | POA: Insufficient documentation

## 2014-10-12 DIAGNOSIS — Z3202 Encounter for pregnancy test, result negative: Secondary | ICD-10-CM | POA: Insufficient documentation

## 2014-10-12 DIAGNOSIS — Z7901 Long term (current) use of anticoagulants: Secondary | ICD-10-CM | POA: Insufficient documentation

## 2014-10-12 DIAGNOSIS — M7989 Other specified soft tissue disorders: Secondary | ICD-10-CM

## 2014-10-12 LAB — CBC WITH DIFFERENTIAL/PLATELET
BASOS ABS: 0 10*3/uL (ref 0.0–0.1)
BASOS PCT: 0 % (ref 0–1)
EOS ABS: 0.1 10*3/uL (ref 0.0–0.7)
Eosinophils Relative: 2 % (ref 0–5)
HCT: 37.3 % (ref 36.0–46.0)
Hemoglobin: 12.4 g/dL (ref 12.0–15.0)
Lymphocytes Relative: 42 % (ref 12–46)
Lymphs Abs: 2.7 10*3/uL (ref 0.7–4.0)
MCH: 28.8 pg (ref 26.0–34.0)
MCHC: 33.2 g/dL (ref 30.0–36.0)
MCV: 86.5 fL (ref 78.0–100.0)
Monocytes Absolute: 0.5 10*3/uL (ref 0.1–1.0)
Monocytes Relative: 7 % (ref 3–12)
NEUTROS PCT: 49 % (ref 43–77)
Neutro Abs: 3.1 10*3/uL (ref 1.7–7.7)
PLATELETS: 216 10*3/uL (ref 150–400)
RBC: 4.31 MIL/uL (ref 3.87–5.11)
RDW: 12.6 % (ref 11.5–15.5)
WBC: 6.4 10*3/uL (ref 4.0–10.5)

## 2014-10-12 LAB — COMPREHENSIVE METABOLIC PANEL
ALBUMIN: 3.7 g/dL (ref 3.5–5.0)
ALT: 10 U/L — ABNORMAL LOW (ref 14–54)
ANION GAP: 7 (ref 5–15)
AST: 14 U/L — AB (ref 15–41)
Alkaline Phosphatase: 56 U/L (ref 38–126)
BUN: 14 mg/dL (ref 6–20)
CALCIUM: 9.1 mg/dL (ref 8.9–10.3)
CO2: 27 mmol/L (ref 22–32)
CREATININE: 0.73 mg/dL (ref 0.44–1.00)
Chloride: 105 mmol/L (ref 101–111)
GFR calc Af Amer: >60 mL/min (ref >60)
GFR calc non Af Amer: >60 mL/min (ref >60)
Glucose, Bld: 112 mg/dL — ABNORMAL HIGH (ref 65–99)
Potassium: 3.8 mmol/L (ref 3.5–5.1)
Sodium: 139 mmol/L (ref 135–145)
TOTAL PROTEIN: 6.6 g/dL (ref 6.5–8.1)
Total Bilirubin: 0.7 mg/dL (ref 0.3–1.2)

## 2014-10-12 LAB — URINE MICROSCOPIC-ADD ON

## 2014-10-12 LAB — URINALYSIS, ROUTINE W REFLEX MICROSCOPIC
BILIRUBIN URINE: NEGATIVE
Glucose, UA: NEGATIVE mg/dL
KETONES UR: NEGATIVE mg/dL
NITRITE: NEGATIVE
Protein, ur: NEGATIVE mg/dL
SPECIFIC GRAVITY, URINE: 1.029 (ref 1.005–1.030)
UROBILINOGEN UA: 1 mg/dL (ref 0.0–1.0)
pH: 6 (ref 5.0–8.0)

## 2014-10-12 LAB — PREGNANCY, URINE: PREG TEST UR: NEGATIVE

## 2014-10-12 NOTE — ED Notes (Signed)
C/o bilateral leg swelling and rt leg pain off and on x 1 year

## 2014-10-12 NOTE — ED Provider Notes (Signed)
CSN: [id number]     Arrival date & time 10/12/14  1918 History   First MD Initiated Contact with Patient 10/12/14 2104     Chief Complaint  Patient presents with  . Leg Swelling     (Consider location/radiation/quality/duration/timing/severity/associated sxs/prior Treatment) HPI Jody Henson is a 24 year old female with no known past medical history who presents the ER complaining of leg swelling. Patient reports ongoing leg swelling over the past year, worsening of her swelling with unilateral swelling to her right lower extremity noted since Friday, 4 days ago. Patient reports mild pain on the lateral aspect of her lower extremity as well. She denies any redness, warmth to her extremities. Patient states she has had similar episode in past with mild relief with diuretic. Patient denies any recent travel, numbness, weakness, shortness of breath, chest pain, abdominal pain, nausea, vomiting.  History reviewed. No pertinent past medical history. History reviewed. No pertinent past surgical history. History reviewed. No pertinent family history. History  Substance Use Topics  . Smoking status: Never Smoker   . Smokeless tobacco: Not on file  . Alcohol Use: No   OB History    Gravida Para Term Preterm AB TAB SAB Ectopic Multiple Living   1 0 0  0          Review of Systems  Constitutional: Negative for fever.  HENT: Negative for trouble swallowing.   Eyes: Negative for visual disturbance.  Respiratory: Negative for shortness of breath.   Cardiovascular: Positive for leg swelling. Negative for chest pain.  Gastrointestinal: Negative for nausea, vomiting and abdominal pain.  Genitourinary: Negative for dysuria.  Musculoskeletal: Negative for neck pain.  Skin: Negative for rash.  Neurological: Negative for dizziness, weakness and numbness.  Psychiatric/Behavioral: Negative.       Allergies  Review of patient's allergies indicates no known allergies.  Home Medications    Prior to Admission medications   Medication Sig Start Date End Date Taking? Authorizing Provider  Multiple Vitamins-Minerals (MULTIVITAMINS THER. W/MINERALS) TABS Take 1 tablet by mouth daily.    Historical Provider, MD  Rivaroxaban (XARELTO) 15 MG TABS tablet Take 1 tablet (15 mg total) by mouth 2 (two) times daily. 10/13/14   Ladona MowJoe Renton Berkley, PA-C   BP 115/79 mmHg  Pulse 69  Temp(Src) 98 F (36.7 C) (Oral)  Resp 18  Wt 230 lb (104.327 kg)  SpO2 100%  LMP 10/08/2014 Physical Exam  Constitutional: She is oriented to person, place, and time. She appears well-developed and well-nourished. No distress.  HENT:  Head: Normocephalic and atraumatic.  Mouth/Throat: Oropharynx is clear and moist. No oropharyngeal exudate.  Eyes: Right eye exhibits no discharge. Left eye exhibits no discharge. No scleral icterus.  Neck: Normal range of motion.  Cardiovascular: Normal rate, regular rhythm and normal heart sounds.   No murmur heard. Pulmonary/Chest: Effort normal and breath sounds normal. No respiratory distress.  Abdominal: Soft. There is no tenderness.  Musculoskeletal: Normal range of motion. She exhibits no edema or tenderness.  Neurological: She is alert and oriented to person, place, and time. No cranial nerve deficit. Coordination normal.  Skin: Skin is warm and dry. No rash noted. She is not diaphoretic.  Bilateral pedal edema. Right worse than left. Right with +2 to +3 pitting edema. Left with less than +1 pitting edema. Bilaterally DP pulses are 2+. Distal sensation intact. Capillary refill less than 2 seconds. Skin is warm, not hot to the touch. No erythema noted.  Psychiatric: She has a normal mood and affect.  Nursing note and vitals reviewed.   ED Course  Procedures (including critical care time) Labs Review Labs Reviewed  COMPREHENSIVE METABOLIC PANEL - Abnormal; Notable for the following:    Glucose, Bld 112 (*)    AST 14 (*)    ALT 10 (*)    All other components within  normal limits  URINALYSIS, ROUTINE W REFLEX MICROSCOPIC - Abnormal; Notable for the following:    APPearance CLOUDY (*)    Hgb urine dipstick TRACE (*)    Leukocytes, UA SMALL (*)    All other components within normal limits  URINE MICROSCOPIC-ADD ON - Abnormal; Notable for the following:    Squamous Epithelial / LPF MANY (*)    Bacteria, UA MANY (*)    All other components within normal limits  CBC WITH DIFFERENTIAL/PLATELET  PREGNANCY, URINE    Imaging Review [place] Venous Img Lower Bilateral  10/13/2014   CLINICAL DATA:  Chronic bilateral lower extremity swelling and calf pain.  EXAM: BILATERAL LOWER EXTREMITY VENOUS DOPPLER ULTRASOUND  TECHNIQUE: Gray-scale sonography with graded compression, as well as color Doppler and duplex ultrasound were performed to evaluate the lower extremity deep venous systems from the level of the common femoral vein and including the common femoral, femoral, profunda femoral, popliteal and calf veins including the posterior tibial, peroneal and gastrocnemius veins when visible. The superficial great saphenous vein was also interrogated. Spectral Doppler was utilized to evaluate flow at rest and with distal augmentation maneuvers in the common femoral, femoral and popliteal veins.  COMPARISON:  None.  FINDINGS: RIGHT LOWER EXTREMITY  Common Femoral Vein: No evidence of thrombus. Normal compressibility, respiratory phasicity and response to augmentation.  Saphenofemoral Junction: No evidence of thrombus. Normal compressibility and flow on color Doppler imaging.  Profunda Femoral Vein: No evidence of thrombus. Normal compressibility and flow on color Doppler imaging.  Femoral Vein: No evidence of thrombus. Normal compressibility, respiratory phasicity and response to augmentation.  Popliteal Vein: No evidence of thrombus. Normal compressibility, respiratory phasicity and response to augmentation.  Calf Veins: No evidence of thrombus. Normal compressibility and flow on color  Doppler imaging.  Superficial Great Saphenous Vein: No evidence of thrombus. Normal compressibility and flow on color Doppler imaging.  Venous Reflux:  None.  Other Findings:  None.  LEFT LOWER EXTREMITY  Common Femoral Vein: No evidence of thrombus. Normal compressibility, respiratory phasicity and response to augmentation.  Saphenofemoral Junction: No evidence of thrombus. Normal compressibility and flow on color Doppler imaging.  Profunda Femoral Vein: No evidence of thrombus. Normal compressibility and flow on color Doppler imaging.  Femoral Vein: No evidence of thrombus. Normal compressibility, respiratory phasicity and response to augmentation.  Popliteal Vein: No evidence of thrombus. Normal compressibility, respiratory phasicity and response to augmentation.  Calf Veins: No evidence of thrombus. Normal compressibility and flow on color Doppler imaging.  Superficial Great Saphenous Vein: No evidence of thrombus. Normal compressibility and flow on color Doppler imaging.  Venous Reflux:  None.  Other Findings:  None.  IMPRESSION: No evidence of deep venous thrombosis.   Electronically Signed   By: Judie PetitM.  Shick M.D.   On: 10/13/2014 10:54     EKG Interpretation None      MDM   Final diagnoses:  Leg swelling    Patient here with bilateral leg swelling, right worse than left. Past several days. Reviewing old notes appears this issue has been ongoing for the past year or longer. Patient stating she has not had a flareup of swelling like this in  the past year. On examination there is pitting edema noted, however there is no warmth or swelling. There is concern for possible DVT. Patient is hemodynamically stable and in no acute distress. No chest or pulmonary symptoms. No concern for ACS or DVT. Lab results unremarkable for acute pathology. Many bacteria noted in urine, however shows many squamous epithelial, consistent with a contaminated catch. Renal function intact. No leukocytosis or anemia.  Electrolytes within normal limits. Patient to be discharged at this time to return to the ER tomorrow for outpatient DVT study.  Radiology set up appointment for patient for this study. Patient given 1 dose of Xarelto tonight to cover for anticoagulation until tomorrow when she receives the DVT study. Strict return precautions discussed with patient, patient verbalizes understanding and agreement of this plan.  BP 115/79 mmHg  Pulse 69  Temp(Src) 98 F (36.7 C) (Oral)  Resp 18  Wt 230 lb (104.327 kg)  SpO2 100%  LMP 10/08/2014  Signed,  Ladona Mow, PA-C 12:54 AM   Ladona Mow, PA-C 10/14/14 1610  Arby Barrette, MD 10/15/14 [phone]

## 2014-10-12 NOTE — ED Notes (Signed)
Pt c/o bil lower leg swelling x 3 days

## 2014-10-13 ENCOUNTER — Ambulatory Visit (HOSPITAL_BASED_OUTPATIENT_CLINIC_OR_DEPARTMENT_OTHER)
Admission: RE | Admit: 2014-10-13 | Discharge: 2014-10-13 | Disposition: A | Discharge status: Home / Self Care | Payer: Medicaid Other | Source: Ambulatory Visit | Attending: Emergency Medicine | Admitting: Emergency Medicine

## 2014-10-13 ENCOUNTER — Other Ambulatory Visit (HOSPITAL_BASED_OUTPATIENT_CLINIC_OR_DEPARTMENT_OTHER): Payer: Self-pay | Admitting: Emergency Medicine

## 2014-10-13 ENCOUNTER — Ambulatory Visit (HOSPITAL_BASED_OUTPATIENT_CLINIC_OR_DEPARTMENT_OTHER): Payer: Medicaid Other

## 2014-10-13 DIAGNOSIS — M7989 Other specified soft tissue disorders: Secondary | ICD-10-CM | POA: Insufficient documentation

## 2014-10-13 DIAGNOSIS — I83893 Varicose veins of bilateral lower extremities with other complications: Secondary | ICD-10-CM

## 2014-10-13 DIAGNOSIS — M79669 Pain in unspecified lower leg: Secondary | ICD-10-CM | POA: Insufficient documentation

## 2014-10-13 MED ORDER — RIVAROXABAN 15 MG PO TABS
15.0000 mg | ORAL_TABLET | Freq: Once | ORAL | Status: AC
  Administered 2014-10-13: 15 mg via ORAL
  Filled 2014-10-13: qty 1

## 2014-10-13 MED ORDER — RIVAROXABAN 15 MG PO TABS: 15.0000 mg | ORAL_TABLET | Freq: Two times a day (BID) | ORAL | Status: DC

## 2014-10-13 NOTE — Discharge Instructions (Signed)
Peripheral Edema °You have swelling in your legs (peripheral edema). This swelling is due to excess accumulation of salt and water in your body. Edema may be a sign of heart, kidney or liver disease, or a side effect of a medication. It may also be due to problems in the leg veins. Elevating your legs and using special support stockings may be very helpful, if the cause of the swelling is due to poor venous circulation. Avoid long periods of standing, whatever the cause. °Treatment of edema depends on identifying the cause. Chips, pretzels, pickles and other salty foods should be avoided. Restricting salt in your diet is almost always needed. Water pills (diuretics) are often used to remove the excess salt and water from your body via urine. These medicines prevent the kidney from reabsorbing sodium. This increases urine flow. °Diuretic treatment may also result in lowering of potassium levels in your body. Potassium supplements may be needed if you have to use diuretics daily. Daily weights can help you keep track of your progress in clearing your edema. You should call your caregiver for follow up care as recommended. °SEEK IMMEDIATE MEDICAL CARE IF:  °· You have increased swelling, pain, redness, or heat in your legs. °· You develop shortness of breath, especially when lying down. °· You develop chest or abdominal pain, weakness, or fainting. °· You have a fever. °Document Released: 06/22/2004 Document Revised: 08/07/2011 Document Reviewed: 06/02/2009 °ExitCare® Patient Information ©2015 [employer]. This information is not intended to replace advice given to you by your health care provider. Make sure you discuss any questions you have with your health care provider. ° ° °Emergency Department Resource Guide °1) Find a Doctor and Pay Out of Pocket °Although you won't have to find out who is covered by your insurance plan, it is a good idea to ask around and get recommendations. You will then need to call the  office and see if the doctor you have chosen will accept you as a new patient and what types of options they offer for patients who are self-pay. Some doctors offer discounts or will set up payment plans for their patients who do not have insurance, but you will need to ask so you aren't surprised when you get to your appointment. ° °2) Contact Your Local Health Department °Not all health departments have doctors that can see patients for sick visits, but many do, so it is worth a call to see if yours does. If you don't know where your local health department is, you can check in your phone book. The CDC also has a tool to help you locate your state's health department, and many state websites also have listings of all of their local health departments. ° °3) Find a Walk-in Clinic °If your illness is not likely to be very severe or complicated, you may want to try a walk in clinic. These are popping up all over the country in pharmacies, drugstores, and shopping centers. They're usually staffed by nurse practitioners or physician assistants that have been trained to treat common illnesses and complaints. They're usually fairly quick and inexpensive. However, if you have serious medical issues or chronic medical problems, these are probably not your best option. ° °No Primary Care Doctor: °- Call [facility] at  832-8000 - they can help you locate a primary care doctor that  accepts your insurance, provides certain services, etc. °- Physician Referral Service- 1-800-533-3463 ° °Chronic Pain Problems: °Organization           Address  Phone   Notes  °[facility]  (336) 297-2271 Patients need to be referred by their primary care doctor.  ° °Medication Assistance: °Organization         Address  Phone   Notes  °[place] [facility] [address] °[place], [place] [address] (336) 641-8030 --Must be a resident of [place] °-- Must have NO insurance coverage  whatsoever (no Medicaid/ Medicare, etc.) °-- The pt. MUST have a primary care doctor that directs their care regularly and follows them in the community °  °[facility]  (866) 331-1348   °[employer]  (888) 892-1162   ° °Agencies that provide inexpensive medical care: °Organization         Address  Phone   Notes  °[facility]  (336) 832-8035   °[facility]    (336) 832-7272   °[facility] [address] °[place], [place] [address] (336) 832-4777   °[facility] [address], °[place] (336) 271-4999   °[facility]    (336) 373-0678   °[facility]    (336) 272-1050   °[facility] ° [address], [place] Phone:  (336) 832-4444, Fax:  (336) 832-4440 Hours of Operation:  9 am - 6 pm, M-F.  Also accepts Medicaid/Medicare and self-pay.  °[facility] ° [address], [place] Phone: (336) 832-3150, Fax: (336) 832-3151. Hours of Operation:  8:30 am - 5:30 pm, M-F.  Also accepts Medicaid and self-pay.  °[facility] [place] [address], [place] Phone: (336) 878-6027   °[facility] [address], [place], [place] (336)723-1848, Ext. 123 Mondays & Thursdays: 7-9 AM.  First 15 patients are seen on a first come, first serve basis. °  ° °Medicaid-accepting [place] Providers: ° °Organization         Address  Phone   Notes  °[facility] [address], [place] (336) 641-2100 Also accepts self-pay patients.  °[facility] [address], [place] ° (336) 856-9996   °[facility] [address], [place] (336) 288-8857   °[facility] [address] [place] [address], [place] (336) 299-7000   °Veita Bland [address], [place]  ° (336) 373-1557 Only accepts [place] Access Medicaid patients after they have their name applied  to their card.  ° °Self-Pay (no insurance) in [place]: ° °Organization         Address  Phone   Notes  °Sickle Cell Patients, [facility] [address], [place] (336) 832-1970   °[facility] [address], [place] (336) 832-4400   ° [facility] ° [address], [place] (336) 992-4800   °[facility]/Dr. Osei-Bonsu ° [address] [place] [address], [place] or [address], [place] (336) 841-8500 Phone number for both [place] and [place] locations is the same.  °[facility] [address], [place] (336) 299-0000   °[facility] [address] [place] [address], [place] or [address] (336) 852-7530 °(336) 878-2260   °[facility] [address],  (336) 350-1642, phone; (336) 294-5005, fax Sees patients 1st and 3rd Saturday of every month.  Must not qualify   for public or private insurance (i.e. Medicaid, Medicare, [place] Health Choice, Veterans' Benefits) • Household income should be no more than 200% of the poverty level •The clinic cannot treat you if you are pregnant or think you are pregnant • Sexually transmitted diseases are not treated at the clinic.  ° ° °Dental Care: °Organization         Address  Phone  Notes  °[facility] [address], [place] (336) 641-6152 Accepts children up to age 21 who are enrolled in Medicaid or [place] Health Choice; pregnant women with a Medicaid card; and children who have applied for Medicaid or [place] Health Choice, but were declined, whose parents can pay a reduced fee at time of service.  °[facility] [place]  [address], [place] (336) 641-7733 Accepts children up to age 21 who are enrolled in Medicaid or [place] Health Choice; pregnant women with a Medicaid card; and children who have applied for Medicaid or [place]  Health Choice, but were declined, whose parents can pay a reduced fee at time of service.  °[facility] ° [address], [place] (336) 641-4533 Patients are seen by appointment only. Walk-ins are not accepted. [facility] will see patients 18 years of age and older. °Monday - Tuesday (8am-5pm) °Most Wednesdays (8:30-5pm) °$30 per visit, cash only  °[facility] ° [address], [place] (336) 641-4533 Patients are seen by appointment only. Walk-ins are not accepted. [facility] will see patients 18 years of age and older. °One Wednesday Evening (Monthly: Volunteer Based).  $30 per visit, cash only  °[facility]  (919) 537-3737 for adults; Children under age 4, call Graduate Pediatric Dentistry at ([phone]) [phone]. Children aged 4-14, please call (919) [phone] to request a pediatric application. ° Dental services are provided in all areas of dental care including fillings, crowns and bridges, complete and partial dentures, implants, gum treatment, root canals, and extractions. Preventive care is also provided. Treatment is provided to both adults and children. °Patients are selected via a lottery and there is often a waiting list. °  °[facility] [address], °[place] ° (336) 763-8833 [email or web address] °  °[facility] [address], [place], [place] (336)723-1848, Ext. 123 Second and Fourth Thursday of each month, opens at 6:30 AM; Clinic ends at 9 AM.  Patients are seen on a first-come first-served basis, and a limited number are seen during each clinic.  ° °[facility] ° [address], [place], [place] (336) 723-7904   Eligibility Requirements °You must have lived in [place], [place], or [place] counties for at least the last three months. °  You cannot be eligible for state or federal sponsored healthcare insurance, including Veterans Administration, Medicaid, or Medicare. °   You generally cannot be eligible for healthcare insurance through your employer.  °  How to apply: °Eligibility screenings are held every Tuesday and Wednesday afternoon from 1:00 pm until 4:00 pm. You do not need an appointment for the interview!  °[facility] [address], [place], [place] 336-631-2330   °[facility]  336-342-8273   °[facility]  336-703-3100   °[facility]  336-570-6415   ° °Behavioral Health Resources in the Community: °Intensive Outpatient Programs °Organization         Address  Phone    Notes  °[facility] [address], [place], [place] 336-878-6098   °[facility] [address], [place], [place] 336-832-9800   °[facility] [address], [place], [place] ° 336-882-2125   °[facility] [address],  °[place], [place] 1-800-853-5163 or 336-641-4981   °Substance Abuse Resources °Organization         Address  Phone  Notes  °[facility]  336-882-2125   °[facility]  336-784-9470   °[facility]  336-285-9073   °[facility]  336-845-3988   °Residential & Outpatient Substance Abuse Program  1-800-659-3381   °Psychological Services °Organization         Address  Phone  Notes  °[facility]  336- 832-9600   °[facility]  336- 378-7881   °[facility] [address], [place] 1-800-853-5163 or 336-641-4981   ° °Mobile Crisis Teams °Organization         Address  Phone  Notes  °[facility], Mobile Crisis Care Unit  1-877-626-1772   °Assertive °[facility] ° [address] [place], [place] 336-834-9664   °Sharon DeEsch [address] °[place] 336-554-5454   ° °Self-Help/Support Groups °Organization         Address  Phone             Notes  °[facility] - variety of support groups  336- 373-1402 Call  for more information  °Narcotics Anonymous (NA), [facility] [address], °[place]  2 meetings at this location  ° °Residential Treatment Programs °Organization         Address  Phone  Notes  °[facility] [address],    °[place]  1-866-801-8205   °[facility] ° [address], [place], [place] 704-293-8524   °[facility] [address], [place] 336-845-3988 Admissions: 8am-3pm M-F  °[facility] [address]    °[place], [place] 336-841-1104   °[facility] [address], [place], [place] 336-379-7146   °[facility] [address]  °[place], [place] 336-285-9073   °[facility] - Intensive Outpatient [address], [place], [place] 336-852-3033   °[facility] [address]  °[place], [place] 1-877-615-2722 or 336-784-9470   °[facility] [address] [place], [place] 336-227-7417 Accepts Medicaid  °[facility] [address]  °[place] 1-800-659-3381 Substance Abuse/Addiction Treatment  ° °[place] Behavioral Health Resources °Organization         Address  Phone  Notes  °[facility]  (888) 581-9988   °Julie Brannon, PhD [address] [place], [place]   (336) 349-5553 or (336) 951-0000   °[facility]   [address] °[place], [place] (336) 349-4454   °[facility] [address], [place], [place] (336) 342-8316 Insurance/Medicaid/sponsorship through [facility]  °[facility] [address]                                    [place], [place] (336) 342-8316 Therapy/tele-psych/case  °[facility] [address]  ° [place],  (336) 349-2233    °Dr. Arfeen  (336) 349-4544   °[facility] [place]  [employer] [place]   [facility] 1) [address], [place] °2) [address], [place] °3)  [address], [place]  (336) 349-3220 °(336) 342-7768 ° °(336) 342-8140   °[place] [facility] (336) 342-1394 or (336) 342-3537 (After Hours)    ° ° ° °

## 2015-08-02 ENCOUNTER — Emergency Department (HOSPITAL_BASED_OUTPATIENT_CLINIC_OR_DEPARTMENT_OTHER)
Admission: EM | Admit: 2015-08-02 | Discharge: 2015-08-03 | Disposition: A | Discharge status: Home / Self Care | Payer: Medicaid Other | Attending: Emergency Medicine | Admitting: Emergency Medicine

## 2015-08-02 ENCOUNTER — Encounter (HOSPITAL_BASED_OUTPATIENT_CLINIC_OR_DEPARTMENT_OTHER): Payer: Self-pay

## 2015-08-02 DIAGNOSIS — N390 Urinary tract infection, site not specified: Secondary | ICD-10-CM | POA: Diagnosis not present

## 2015-08-02 DIAGNOSIS — Z3202 Encounter for pregnancy test, result negative: Secondary | ICD-10-CM | POA: Insufficient documentation

## 2015-08-02 DIAGNOSIS — Z7901 Long term (current) use of anticoagulants: Secondary | ICD-10-CM | POA: Insufficient documentation

## 2015-08-02 DIAGNOSIS — R1030 Lower abdominal pain, unspecified: Secondary | ICD-10-CM | POA: Diagnosis not present

## 2015-08-02 DIAGNOSIS — B9689 Other specified bacterial agents as the cause of diseases classified elsewhere: Secondary | ICD-10-CM

## 2015-08-02 DIAGNOSIS — N760 Acute vaginitis: Secondary | ICD-10-CM | POA: Insufficient documentation

## 2015-08-02 DIAGNOSIS — R350 Frequency of micturition: Secondary | ICD-10-CM | POA: Diagnosis present

## 2015-08-02 DIAGNOSIS — Z79899 Other long term (current) drug therapy: Secondary | ICD-10-CM | POA: Insufficient documentation

## 2015-08-02 LAB — URINALYSIS, ROUTINE W REFLEX MICROSCOPIC
Bilirubin Urine: NEGATIVE
Glucose, UA: NEGATIVE mg/dL
Hgb urine dipstick: NEGATIVE
Ketones, ur: NEGATIVE mg/dL
Nitrite: NEGATIVE
PROTEIN: NEGATIVE mg/dL
Specific Gravity, Urine: 1.026 (ref 1.005–1.030)
pH: 6 (ref 5.0–8.0)

## 2015-08-02 LAB — URINE MICROSCOPIC-ADD ON: RBC / HPF: NONE SEEN RBC/hpf (ref 0–5)

## 2015-08-02 LAB — WET PREP, GENITAL
Sperm: NONE SEEN
Trich, Wet Prep: NONE SEEN
Yeast Wet Prep HPF POC: NONE SEEN

## 2015-08-02 LAB — PREGNANCY, URINE: PREG TEST UR: NEGATIVE

## 2015-08-02 MED ORDER — CEFTRIAXONE SODIUM 250 MG IJ SOLR
250.0000 mg | Freq: Once | INTRAMUSCULAR | Status: AC
  Administered 2015-08-02: 250 mg via INTRAMUSCULAR
  Filled 2015-08-02: qty 250

## 2015-08-02 MED ORDER — LIDOCAINE HCL (PF) 1 % IJ SOLN
INTRAMUSCULAR | Status: AC
  Administered 2015-08-02: 5 mL
  Filled 2015-08-02: qty 5

## 2015-08-02 MED ORDER — AZITHROMYCIN 250 MG PO TABS
1000.0000 mg | ORAL_TABLET | Freq: Once | ORAL | Status: AC
  Administered 2015-08-02: 1000 mg via ORAL
  Filled 2015-08-02: qty 4

## 2015-08-02 NOTE — ED Notes (Addendum)
Pt reports urinary frequency, suprapubic tenderness and pelvic pain x 2 weeks.  Reports brownish vaginal discharge. Had d&c within the past few months.

## 2015-08-03 LAB — GC/CHLAMYDIA PROBE AMP (~~LOC~~) NOT AT ARMC
CHLAMYDIA, DNA PROBE: NEGATIVE
Neisseria Gonorrhea: NEGATIVE

## 2015-08-03 MED ORDER — CEPHALEXIN 500 MG PO CAPS: 500.0000 mg | ORAL_CAPSULE | Freq: Two times a day (BID) | ORAL | Status: DC

## 2015-08-03 MED ORDER — METRONIDAZOLE 500 MG PO TABS: 500.0000 mg | ORAL_TABLET | Freq: Two times a day (BID) | ORAL | Status: DC

## 2015-08-03 NOTE — ED Provider Notes (Signed)
CSN: [id number]     Arrival date & time 08/02/15  1914 History   First MD Initiated Contact with Patient 08/02/15 2232     Chief Complaint  Patient presents with  . Urinary Frequency     (Consider location/radiation/quality/duration/timing/severity/associated sxs/prior Treatment) HPI Comments: Patient presents today with complaints or urinary urgency and frequency for the past 3 weeks.  She is also complaining of a brownish colored vaginal discharge for the past 3 weeks.  She also is describing some lower abdominal pressure that has also been present for the past 3 weeks.  She describes the pain as "feels like pee is going to come out."  She has not taken anything for her symptoms.  She reports that she had a D & C for an elective abortion roughly four week ago.  She denies any vaginal bleeding at this time.  Denies fever, chills, nausea, vomiting, diarrhea, dysuria, or hematuria.  She [facility].  The history is provided by the patient.    History reviewed. No pertinent past medical history. Past Surgical History  Procedure Laterality Date  . Dilation and curettage of uterus     No family history on file. Social History  Substance Use Topics  . Smoking status: Never Smoker   . Smokeless tobacco: None  . Alcohol Use: No   OB History    Gravida Para Term Preterm AB TAB SAB Ectopic Multiple Living   1 0 0  0          Review of Systems  All other systems reviewed and are negative.     Allergies  Review of patient's allergies indicates no known allergies.  Home Medications   Prior to Admission medications   Medication Sig Start Date End Date Taking? Authorizing Provider  Multiple Vitamins-Minerals (MULTIVITAMINS THER. W/MINERALS) TABS Take 1 tablet by mouth daily.    Historical Provider, MD  Rivaroxaban (XARELTO) 15 MG TABS tablet Take 1 tablet (15 mg total) by mouth 2 (two) times daily. 10/13/14   Ladona MowJoe Mintz, PA-C   BP 119/84 mmHg   Pulse 73  Temp(Src) 97.9 F (36.6 C) (Oral)  Resp 18  Ht 5\' 6"  (1.676 m)  Wt 104.327 kg  BMI 37.14 kg/m2  SpO2 100%  LMP 05/24/2015 Physical Exam  Constitutional: She appears well-developed and well-nourished.  HENT:  Head: Normocephalic and atraumatic.  Mouth/Throat: Oropharynx is clear and moist.  Neck: Normal range of motion. Neck supple.  Cardiovascular: Normal rate, regular rhythm and normal heart sounds.   Pulmonary/Chest: Effort normal and breath sounds normal.  Abdominal: Soft. Bowel sounds are normal. She exhibits no distension and no mass. There is tenderness. There is no rebound and no guarding.  Mild tenderness to palpation across lower abdomen  Genitourinary: Cervix exhibits no motion tenderness. Right adnexum displays no mass, no tenderness and no fullness. Left adnexum displays no mass, no tenderness and no fullness.  Whitish colored discharge in the vaginal vault  Musculoskeletal: Normal range of motion.  Neurological: She is alert.  Skin: Skin is warm and dry.  Psychiatric: She has a normal mood and affect.  Nursing note and vitals reviewed.   ED Course  Procedures (including critical care time) Labs Review Labs Reviewed  WET PREP, GENITAL - Abnormal; Notable for the following:    Clue Cells Wet Prep HPF POC PRESENT (*)    WBC, Wet Prep HPF POC MANY (*)    All other components within normal limits  URINALYSIS,  ROUTINE W REFLEX MICROSCOPIC (NOT AT [facility]) - Abnormal; Notable for the following:    Leukocytes, UA SMALL (*)    All other components within normal limits  URINE MICROSCOPIC-ADD ON - Abnormal; Notable for the following:    Squamous Epithelial / LPF 0-5 (*)    Bacteria, UA MANY (*)    All other components within normal limits  URINE CULTURE  PREGNANCY, URINE  GC/CHLAMYDIA PROBE AMP ([facility]) NOT AT [facility]    Imaging Review No results found. I have personally reviewed and evaluated these images and lab results as part of my medical  decision-making.   EKG Interpretation None      MDM   Final diagnoses:  None   Patient presents today with lower abdominal pressure, urinary frequency, and vaginal discharge.  No CMT or adnexal tenderness with pelvic exam.  Mild lower abdominal tenderness to palpation, but no rebound or guarding.   Urine pregnancy is negative.  Patient is afebrile.  UA showing many bacteria, but only small leukocytes.  Urine cultured and patient started on antibiotics.  Wet prep showing clue cells consistent with BV.  Patient given Rx for Flagyl.  GC/Chlamydia pending.  Patient instructed to follow up with her OB/GYN.  Stable for discharge.  Return precautions given.      Santiago Glad, PA-C 08/04/15 2200  Nelva Nay, MD 08/05/15 [phone]

## 2015-08-04 LAB — URINE CULTURE

## 2015-10-31 ENCOUNTER — Encounter (HOSPITAL_BASED_OUTPATIENT_CLINIC_OR_DEPARTMENT_OTHER): Payer: Self-pay | Admitting: *Deleted

## 2015-10-31 DIAGNOSIS — N898 Other specified noninflammatory disorders of vagina: Secondary | ICD-10-CM | POA: Diagnosis present

## 2015-10-31 DIAGNOSIS — N760 Acute vaginitis: Secondary | ICD-10-CM | POA: Diagnosis not present

## 2015-10-31 NOTE — ED Notes (Signed)
Vaginal discharge x 3 days.  Hx of BV-reports that she thinks it is the same.

## 2015-11-01 ENCOUNTER — Emergency Department (HOSPITAL_BASED_OUTPATIENT_CLINIC_OR_DEPARTMENT_OTHER)
Admission: EM | Admit: 2015-11-01 | Discharge: 2015-11-01 | Disposition: A | Discharge status: Home / Self Care | Payer: Medicaid Other | Attending: Emergency Medicine | Admitting: Emergency Medicine

## 2015-11-01 ENCOUNTER — Encounter (HOSPITAL_BASED_OUTPATIENT_CLINIC_OR_DEPARTMENT_OTHER): Payer: Self-pay | Admitting: Emergency Medicine

## 2015-11-01 DIAGNOSIS — B9689 Other specified bacterial agents as the cause of diseases classified elsewhere: Secondary | ICD-10-CM

## 2015-11-01 DIAGNOSIS — N760 Acute vaginitis: Secondary | ICD-10-CM

## 2015-11-01 LAB — WET PREP, GENITAL
SPERM: NONE SEEN
TRICH WET PREP: NONE SEEN
YEAST WET PREP: NONE SEEN

## 2015-11-01 LAB — PREGNANCY, URINE: Preg Test, Ur: NEGATIVE

## 2015-11-01 LAB — URINALYSIS, ROUTINE W REFLEX MICROSCOPIC
BILIRUBIN URINE: NEGATIVE
GLUCOSE, UA: NEGATIVE mg/dL
HGB URINE DIPSTICK: NEGATIVE
Ketones, ur: NEGATIVE mg/dL
Nitrite: NEGATIVE
PH: 7 (ref 5.0–8.0)
Protein, ur: NEGATIVE mg/dL
SPECIFIC GRAVITY, URINE: 1.024 (ref 1.005–1.030)

## 2015-11-01 LAB — URINE MICROSCOPIC-ADD ON: RBC / HPF: NONE SEEN RBC/hpf (ref 0–5)

## 2015-11-01 MED ORDER — METRONIDAZOLE 500 MG PO TABS
500.0000 mg | ORAL_TABLET | Freq: Once | ORAL | Status: AC
  Administered 2015-11-01: 500 mg via ORAL
  Filled 2015-11-01: qty 1

## 2015-11-01 MED ORDER — METRONIDAZOLE 500 MG PO TABS: 500.0000 mg | ORAL_TABLET | Freq: Two times a day (BID) | ORAL | Status: DC

## 2015-11-01 NOTE — ED Notes (Signed)
C/o intermitant back pain and LLQ pain, also brown foul d/c, comes and goes, similar to BV and UTI in march, (denies: nvd, fever, bleeding, itching or other sx), last BM 1900, last BM yesterday normal.

## 2015-11-01 NOTE — Discharge Instructions (Signed)

## 2015-11-01 NOTE — ED Provider Notes (Signed)
CSN: [id number]     Arrival date & time 10/31/15  2313 History   First MD Initiated Contact with Patient 11/01/15 0304     Chief Complaint  Patient presents with  . Vaginal Discharge     (Consider location/radiation/quality/duration/timing/severity/associated sxs/prior Treatment) Patient is a 25 y.o. female presenting with vaginal discharge. The history is provided by the patient.  Vaginal Discharge Quality:  White Severity:  Moderate Onset quality:  Gradual Timing:  Constant Progression:  Unchanged Chronicity:  Recurrent Context: not after intercourse   Relieved by:  Nothing Worsened by:  Nothing tried Ineffective treatments:  None tried Associated symptoms: no abdominal pain, no genital lesions and no urinary hesitancy   Risk factors: no new sexual partner and no STI exposure     History reviewed. No pertinent past medical history. Past Surgical History  Procedure Laterality Date  . Dilation and curettage of uterus     History reviewed. No pertinent family history. Social History  Substance Use Topics  . Smoking status: Never Smoker   . Smokeless tobacco: None  . Alcohol Use: No   OB History    Gravida Para Term Preterm AB TAB SAB Ectopic Multiple Living   1 0 0  0          Review of Systems  Gastrointestinal: Negative for abdominal pain.  Genitourinary: Positive for vaginal discharge. Negative for hesitancy.  All other systems reviewed and are negative.     Allergies  Review of patient's allergies indicates no known allergies.  Home Medications   Prior to Admission medications   Not on File   BP 120/77 mmHg  Pulse 81  Temp(Src) 98.2 F (36.8 C) (Oral)  Resp 18  Ht  (1.676 m)  Wt 240 lb (108.863 kg)  BMI 38.76 kg/m2  SpO2 99%  LMP 10/22/2015 Physical Exam  Constitutional: She is oriented to person, place, and time. She appears well-developed and well-nourished. No distress.  HENT:  Head: Normocephalic and atraumatic.  Mouth/Throat:  Oropharynx is clear and moist.  Eyes: Conjunctivae are normal. Pupils are equal, round, and reactive to light.  Neck: Normal range of motion. Neck supple.  Cardiovascular: Normal rate, regular rhythm and intact distal pulses.   Pulmonary/Chest: Effort normal and breath sounds normal. No respiratory distress. She has no wheezes. She has no rales.  Abdominal: Soft. Bowel sounds are normal. There is no tenderness. There is no rebound and no guarding.  Musculoskeletal: Normal range of motion.  Neurological: She is alert and oriented to person, place, and time.  Skin: Skin is warm and dry.  Psychiatric: She has a normal mood and affect.    ED Course  Procedures (including critical care time) Labs Review Labs Reviewed  WET PREP, GENITAL - Abnormal; Notable for the following:    Clue Cells Wet Prep HPF POC PRESENT (*)    WBC, Wet Prep HPF POC MANY (*)    All other components within normal limits  URINALYSIS, ROUTINE W REFLEX MICROSCOPIC (NOT AT [facility]) - Abnormal; Notable for the following:    Leukocytes, UA SMALL (*)    All other components within normal limits  URINE MICROSCOPIC-ADD ON - Abnormal; Notable for the following:    Squamous Epithelial / LPF 0-5 (*)    Bacteria, UA RARE (*)    All other components within normal limits  PREGNANCY, URINE  GC/CHLAMYDIA PROBE AMP () NOT AT [facility]    Imaging Review No results found. I have personally reviewed and evaluated these images  and lab results as part of my medical decision-making.   EKG Interpretation None      MDM   Final diagnoses:  None    Filed Vitals:   10/31/15 2327  BP: 120/77  Pulse: 81  Temp: 98.2 F (36.8 C)  Resp: 18    Results for orders placed or performed during the hospital encounter of 11/01/15  Wet prep, genital  Result Value Ref Range   Yeast Wet Prep HPF POC NONE SEEN NONE SEEN   Trich, Wet Prep NONE SEEN NONE SEEN   Clue Cells Wet Prep HPF POC PRESENT (A) NONE SEEN   WBC, Wet Prep HPF  POC MANY (A) NONE SEEN   Sperm NONE SEEN   Urinalysis, Routine w reflex microscopic (not [facility])  Result Value Ref Range   Color, Urine YELLOW YELLOW   APPearance CLEAR CLEAR   Specific Gravity, Urine 1.024 1.005 - 1.030   pH 7.0 5.0 - 8.0   Glucose, UA NEGATIVE NEGATIVE mg/dL   Hgb urine dipstick NEGATIVE NEGATIVE   Bilirubin Urine NEGATIVE NEGATIVE   Ketones, ur NEGATIVE NEGATIVE mg/dL   Protein, ur NEGATIVE NEGATIVE mg/dL   Nitrite NEGATIVE NEGATIVE   Leukocytes, UA SMALL (A) NEGATIVE  Pregnancy, urine  Result Value Ref Range   Preg Test, Ur NEGATIVE NEGATIVE  Urine microscopic-add on  Result Value Ref Range   Squamous Epithelial / LPF 0-5 (A) NONE SEEN   WBC, UA 0-5 0 - 5 WBC/hpf   RBC / HPF NONE SEEN 0 - 5 RBC/hpf   Bacteria, UA RARE (A) NONE SEEN   No results found.  Will treat for BV with flagyl. Follow up with your GYN.                  Cy BlamerApril Lasean Rahming, MD 11/01/15 [phone]

## 2015-11-02 LAB — GC/CHLAMYDIA PROBE AMP (~~LOC~~) NOT AT ARMC
CHLAMYDIA, DNA PROBE: NEGATIVE
NEISSERIA GONORRHEA: NEGATIVE

## 2016-01-10 ENCOUNTER — Emergency Department (HOSPITAL_BASED_OUTPATIENT_CLINIC_OR_DEPARTMENT_OTHER)
Admission: EM | Admit: 2016-01-10 | Discharge: 2016-01-10 | Disposition: A | Discharge status: Home / Self Care | Payer: Medicaid Other | Attending: Emergency Medicine | Admitting: Emergency Medicine

## 2016-01-10 ENCOUNTER — Encounter (HOSPITAL_BASED_OUTPATIENT_CLINIC_OR_DEPARTMENT_OTHER): Payer: Self-pay | Admitting: Emergency Medicine

## 2016-01-10 DIAGNOSIS — R600 Localized edema: Secondary | ICD-10-CM | POA: Insufficient documentation

## 2016-01-10 DIAGNOSIS — M7989 Other specified soft tissue disorders: Secondary | ICD-10-CM | POA: Diagnosis present

## 2016-01-10 DIAGNOSIS — R609 Edema, unspecified: Secondary | ICD-10-CM

## 2016-01-10 LAB — BASIC METABOLIC PANEL
ANION GAP: 4 — AB (ref 5–15)
BUN: 14 mg/dL (ref 6–20)
CALCIUM: 8.6 mg/dL — AB (ref 8.9–10.3)
CO2: 25 mmol/L (ref 22–32)
Chloride: 109 mmol/L (ref 101–111)
Creatinine, Ser: 0.81 mg/dL (ref 0.44–1.00)
GFR calc non Af Amer: >60 mL/min (ref >60)
Glucose, Bld: 96 mg/dL (ref 65–99)
POTASSIUM: 4 mmol/L (ref 3.5–5.1)
Sodium: 138 mmol/L (ref 135–145)

## 2016-01-10 LAB — PREGNANCY, URINE: PREG TEST UR: NEGATIVE

## 2016-01-10 LAB — BRAIN NATRIURETIC PEPTIDE: B NATRIURETIC PEPTIDE 5: 38 pg/mL (ref 0.0–100.0)

## 2016-01-10 NOTE — Discharge Instructions (Signed)
Return to the ED with any concerns including chest pain, difficulty breathing, one leg more swollen than another, or any other alarming symptoms

## 2016-01-10 NOTE — ED Triage Notes (Signed)
Pt reports left lower leg swelling x 2 days. Denies injury and reports having the same issue in the right leg that have since resolved. Denies taking any meds. Positive pulse ands sensation.

## 2016-01-10 NOTE — ED Provider Notes (Signed)
[facility]-EMER[facility] DEPT Provider Note   CSN: [id number] Arrival date & time: 01/10/16  [id number]  First Provider Contact:  None       History   Chief Complaint Chief Complaint  Patient presents with  . Leg Swelling    HPI Jody Henson is a 25 y.o. female.  HPI  Pt presenting with c/o swelling in bilateral lower extremities.  Pt states the swelling comes and goes- this episode has been going on for the past 2 days.  No chest pain, no shortness of breath.  Both legs are swollen equally.  She states she does stand on her feet a lot for work. Usually swelling will improve after putting feet up overnight- but this morning swelling was still present which prompted ED visit.  No injuries or trauma.  There are no other associated systemic symptoms, there are no other alleviating or modifying factors.   History reviewed. No pertinent past medical history.  There are no active problems to display for this patient.   Past Surgical History:  Procedure Laterality Date  . DILATION AND CURETTAGE OF UTERUS      OB History    Gravida Para Term Preterm AB Living   1 0 0   0     SAB TAB Ectopic Multiple Live Births                   Home Medications    Prior to Admission medications   Medication Sig Start Date End Date Taking? Authorizing Provider  metroNIDAZOLE (FLAGYL) 500 MG tablet Take 1 tablet (500 mg total) by mouth 2 (two) times daily. One po bid x 7 days 11/01/15   April Palumbo, MD    Family History No family history on file.  Social History Social History  Substance Use Topics  . Smoking status: Never Smoker  . Smokeless tobacco: Never Used  . Alcohol use No     Allergies   Review of patient's allergies indicates no known allergies.   Review of Systems Review of Systems ROS reviewed and all otherwise negative except for mentioned in HPI  Physical Exam Updated Vital Signs BP 109/60 (BP Location: Right Arm)   Pulse 73   Temp 98.1 F (36.7 C) (Oral)   Resp 15    Ht 5\' 6"  (1.676 m)   Wt 106.6 kg   LMP 01/10/2016 (Exact Date)   SpO2 100%   BMI 37.93 kg/m  Vitals reviewed Physical Exam Physical Examination: General appearance - alert, well appearing, and in no distress Mental status - alert, oriented to person, place, and time Eyes - no conjunctival injection no scleral icterus Chest - clear to auscultation, no wheezes, rales or rhonchi, symmetric air entry Heart - normal rate, regular rhythm, normal S1, S2, no murmurs, rubs, clicks or gallops Abdomen - soft, nontender, nondistended, no masses or organomegaly Extremities - peripheral pulses normal, bilateral 2+ pitting edema at ankles and 1/2 way up anterior tibia,  no clubbing or cyanosis, swelling is symmetric, negative homans sign, no palpable cords Skin - normal coloration and turgor, no rashes  ED Treatments / Results  Labs (all labs ordered are listed, but only abnormal results are displayed) Labs Reviewed  BASIC METABOLIC PANEL - Abnormal; Notable for the following:       Result Value   Calcium 8.6 (*)    Anion gap 4 (*)    All other components within normal limits  PREGNANCY, URINE  BRAIN NATRIURETIC PEPTIDE    EKG  EKG Interpretation  None       Radiology No results found.  Procedures Procedures (including critical care time)  Medications Ordered in ED Medications - No data to display   Initial Impression / Assessment and Plan / ED Course  I have reviewed the triage vital signs and the nursing notes.  Pertinent labs & imaging results that were available during my care of the patient were reviewed by me and considered in my medical decision making (see chart for details).  Clinical Course    Pt with bilateral lower extremity swelling, no chest pain or shortness of breath to suggest pulmonary edema or PE.  Swelling is symmetric making DVT unlikely.  Renal function is normal.  Pt advised to keep legs elevated, decreased salt in diet, f/u with PMD.  Discharged with  strict return precautions.  Pt agreeable with plan.  Final Clinical Impressions(s) / ED Diagnoses   Final diagnoses:  Peripheral edema    New Prescriptions Discharge Medication List as of 01/10/2016 10:30 AM       Jerelyn ScottMartha Linker, MD 01/11/16 [phone]

## 2016-10-15 ENCOUNTER — Emergency Department (HOSPITAL_BASED_OUTPATIENT_CLINIC_OR_DEPARTMENT_OTHER)
Admission: EM | Admit: 2016-10-15 | Discharge: 2016-10-15 | Disposition: A | Discharge status: Home / Self Care | Payer: Medicaid Other | Attending: Emergency Medicine | Admitting: Emergency Medicine

## 2016-10-15 ENCOUNTER — Encounter (HOSPITAL_BASED_OUTPATIENT_CLINIC_OR_DEPARTMENT_OTHER): Payer: Self-pay | Admitting: Adult Health

## 2016-10-15 DIAGNOSIS — H1031 Unspecified acute conjunctivitis, right eye: Secondary | ICD-10-CM | POA: Diagnosis not present

## 2016-10-15 DIAGNOSIS — H5711 Ocular pain, right eye: Secondary | ICD-10-CM | POA: Diagnosis present

## 2016-10-15 MED ORDER — KETOTIFEN FUMARATE 0.025 % OP SOLN: 1.0000 [drp] | Freq: Two times a day (BID) | OPHTHALMIC | 0 refills | Status: DC

## 2016-10-15 MED ORDER — FLUORESCEIN SODIUM 0.6 MG OP STRP
1.0000 | ORAL_STRIP | Freq: Once | OPHTHALMIC | Status: AC
  Administered 2016-10-15: 1 via OPHTHALMIC
  Filled 2016-10-15: qty 1

## 2016-10-15 MED ORDER — TETRACAINE HCL 0.5 % OP SOLN
2.0000 [drp] | Freq: Once | OPHTHALMIC | Status: AC
  Administered 2016-10-15: 2 [drp] via OPHTHALMIC
  Filled 2016-10-15: qty 4

## 2016-10-15 MED ORDER — GENTAMICIN SULFATE 0.3 % OP SOLN: 1.0000 [drp] | OPHTHALMIC | 0 refills | Status: DC

## 2016-10-15 NOTE — ED Provider Notes (Signed)
[facility]PT Provider Note   CSN: [id number] Arrival date & time: 10/15/16  1542  By signing my name below, I, Orpah CobbMaurice Copeland, attest that this documentation has been prepared under the direction and in the presence of Arthor CaptainAbigail Ngozi Alvidrez, PA-C. Electronically Signed: Lyndon CodeMaurice Copeland Jr., ED Scribe. 10/17/16. 11:21 AM.   History   Chief Complaint Chief Complaint  Patient presents with  . Foreign Body in Eye    HPI Jody Henson is a 26 y.o. female who presents to the Emergency Department complaining of foreign body in the R eye with onset x2 days. Pt states that x1 day ago, she felt as if something was in the R eye. She reports blurred vision. Pt reportedly flushed the eye with water and eye drops with no relief. Pt denies eye discharge. She reports hx of seasonal allergies. Pt denies wearing glasses/contact lenses, this happening in the past, recent eye exposure. Of note, pt states that when she awoke this morning and the R eye was swollen shut.   The history is provided by the patient. No language interpreter was used.    History reviewed. No pertinent past medical history.  There are no active problems to display for this patient.   Past Surgical History:  Procedure Laterality Date  . DILATION AND CURETTAGE OF UTERUS      OB History    Gravida Para Term Preterm AB Living   1 0 0   0     SAB TAB Ectopic Multiple Live Births                   Home Medications    Prior to Admission medications   Medication Sig Start Date End Date Taking? Authorizing Provider  gentamicin (GARAMYCIN) 0.3 % ophthalmic solution Place 1 drop into the right eye every 4 (four) hours. 10/15/16   Arthor CaptainHarris, Tamara Kenyon, PA-C  ketotifen (ZADITOR) 0.025 % ophthalmic solution Place 1 drop into the right eye 2 (two) times daily. 10/15/16   Arthor CaptainHarris, Marsi Turvey, PA-C  metroNIDAZOLE (FLAGYL) 500 MG tablet Take 1 tablet (500 mg total) by mouth 2 (two) times daily. One po bid x 7 days 11/01/15   Nicanor AlconPalumbo, April,  MD    Family History History reviewed. No pertinent family history.  Social History Social History  Substance Use Topics  . Smoking status: Never Smoker  . Smokeless tobacco: Never Used  . Alcohol use No     Allergies   Patient has no known allergies.   Review of Systems Review of Systems  Constitutional: Negative for fever.  Eyes: Positive for pain, redness and visual disturbance. Negative for discharge.    Physical Exam Updated Vital Signs BP 124/84 (BP Location: Right Arm)   Pulse 98   Temp 98.4 F (36.9 C) (Oral)   Resp 18   Ht 5\' 6"  (1.676 m)   Wt 110.2 kg (243 lb)   LMP 10/06/2016 (Approximate)   SpO2 100%   Breastfeeding? No   BMI 39.22 kg/m   Physical Exam  Constitutional: She appears well-developed and well-nourished. No distress.  HENT:  Head: Normocephalic and atraumatic.  Eyes: Conjunctivae are normal. Lids are everted and swept, no foreign bodies found.  R eye with erythema. Pt has swelling and erythema under the R lid. No fluorescein uptake. Pt's exam is consistent with conjunctivitis.   Neck: Neck supple.  Cardiovascular: Normal rate and regular rhythm.   No murmur heard. Pulmonary/Chest: Effort normal and breath sounds normal. No respiratory distress.  Abdominal: Soft. There is  no tenderness.  Musculoskeletal: She exhibits no edema.  Neurological: She is alert.  Skin: Skin is warm and dry.  Psychiatric: She has a normal mood and affect.  Nursing note and vitals reviewed.    ED Treatments / Results   DIAGNOSTIC STUDIES: Oxygen Saturation is 99% on RA, normal by my interpretation.   COORDINATION OF CARE: 11:21 AM-Discussed next steps with pt. Pt verbalized understanding and is agreeable with the plan.    Labs (all labs ordered are listed, but only abnormal results are displayed) Labs Reviewed - No data to display  EKG  EKG Interpretation None       Radiology No results found.  Procedures Procedures (including critical  care time)  Medications Ordered in ED Medications  fluorescein ophthalmic strip 1 strip (1 strip Right Eye Given 10/15/16 1620)  tetracaine (PONTOCAINE) 0.5 % ophthalmic solution 2 drop (2 drops Right Eye Given 10/15/16 1620)     Initial Impression / Assessment and Plan / ED Course  I have reviewed the triage vital signs and the nursing notes.  Pertinent labs & imaging results that were available during my care of the patient were reviewed by me and considered in my medical decision making (see chart for details).       Patient presentation consistent with conjunctivitis.   + mild discharge. Nocorneal abrasions, entrapment, consensual photophobia, or dendritic staining with fluorescein study.  Presentation non-concerning for iritis, bacterial conjunctivitis, corneal abrasions, or HSV.   Antibioticswill be prescribed ketotifen for discomfort.  Personal hygiene and frequent handwashing discussed.  Patient advised to followup with ophthalmologist if symptoms persist or worsen in any way including vision change or purulent discharge.  Patient verbalizes understanding and is agreeable with discharge.   Final Clinical Impressions(s) / ED Diagnoses   Final diagnoses:  Acute conjunctivitis of right eye, unspecified acute conjunctivitis type    New Prescriptions Discharge Medication List as of 10/15/2016  5:31 PM    START taking these medications   Details  gentamicin (GARAMYCIN) 0.3 % ophthalmic solution Place 1 drop into the right eye every 4 (four) hours., Starting Sun 10/15/2016, Print    ketotifen (ZADITOR) 0.025 % ophthalmic solution Place 1 drop into the right eye 2 (two) times daily., Starting Sun 10/15/2016, Print      I personally performed the services described in this documentation, which was scribed in my presence. The recorded information has been reviewed and is accurate.      Arthor Captain, PA-C 10/17/16 1122    Jerelyn Scott, MD 10/18/16 [phone]

## 2016-10-15 NOTE — ED Triage Notes (Signed)
Presents with feeling like something is in her right eye, began last night. She flushed her eye and used eye drops and eye is still watery and blurred. Denies light sensitivity. Denies pain.

## 2016-10-15 NOTE — Discharge Instructions (Signed)
Get help right away if: You have a fever and your symptoms suddenly get worse. You have severe pain when you move your eye. You have facial pain, redness, or swelling. You have sudden loss of vision.

## 2017-01-06 ENCOUNTER — Emergency Department (HOSPITAL_BASED_OUTPATIENT_CLINIC_OR_DEPARTMENT_OTHER)
Admission: EM | Admit: 2017-01-06 | Discharge: 2017-01-06 | Disposition: A | Discharge status: Home / Self Care | Payer: Medicaid Other | Attending: Emergency Medicine | Admitting: Emergency Medicine

## 2017-01-06 ENCOUNTER — Encounter (HOSPITAL_BASED_OUTPATIENT_CLINIC_OR_DEPARTMENT_OTHER): Payer: Self-pay | Admitting: Emergency Medicine

## 2017-01-06 DIAGNOSIS — R1110 Vomiting, unspecified: Secondary | ICD-10-CM | POA: Insufficient documentation

## 2017-01-06 DIAGNOSIS — Z5321 Procedure and treatment not carried out due to patient leaving prior to being seen by health care provider: Secondary | ICD-10-CM | POA: Diagnosis not present

## 2017-01-06 LAB — URINALYSIS, ROUTINE W REFLEX MICROSCOPIC
Bilirubin Urine: NEGATIVE
Glucose, UA: NEGATIVE mg/dL
HGB URINE DIPSTICK: NEGATIVE
Ketones, ur: NEGATIVE mg/dL
Leukocytes, UA: NEGATIVE
Nitrite: NEGATIVE
PH: 7.5 (ref 5.0–8.0)
Protein, ur: NEGATIVE mg/dL
SPECIFIC GRAVITY, URINE: 1.028 (ref 1.005–1.030)

## 2017-01-06 LAB — PREGNANCY, URINE: PREG TEST UR: NEGATIVE

## 2017-01-06 NOTE — ED Triage Notes (Signed)
Patient reports that she has had Nausea and vomiting with lower back pain x 2 days

## 2017-01-06 NOTE — ED Notes (Signed)
EMT saw pt and pt's child leave. Pt gave no explanation and just left. RN notified.

## 2017-01-06 NOTE — ED Notes (Signed)
Pt left without being seen by EDP after triage.

## 2017-01-06 NOTE — ED Notes (Signed)
Pt states she may have to leave because "my son won't sit still". Pt encouraged to stay and be seen by EDP. Pt states "pain is not that bad, it can wait until Monday". Again, pt encouraged to wait to be seen by EDP.

## 2017-02-06 ENCOUNTER — Emergency Department (HOSPITAL_BASED_OUTPATIENT_CLINIC_OR_DEPARTMENT_OTHER)
Admission: EM | Admit: 2017-02-06 | Discharge: 2017-02-06 | Disposition: A | Discharge status: Home / Self Care | Payer: Medicaid Other | Attending: Emergency Medicine | Admitting: Emergency Medicine

## 2017-02-06 ENCOUNTER — Encounter (HOSPITAL_BASED_OUTPATIENT_CLINIC_OR_DEPARTMENT_OTHER): Payer: Self-pay | Admitting: Emergency Medicine

## 2017-02-06 DIAGNOSIS — R630 Anorexia: Secondary | ICD-10-CM | POA: Diagnosis present

## 2017-02-06 DIAGNOSIS — R110 Nausea: Secondary | ICD-10-CM

## 2017-02-06 DIAGNOSIS — Z79899 Other long term (current) drug therapy: Secondary | ICD-10-CM | POA: Insufficient documentation

## 2017-02-06 LAB — PREGNANCY, URINE: Preg Test, Ur: NEGATIVE

## 2017-02-06 NOTE — ED Notes (Signed)
ED Provider at bedside. 

## 2017-02-06 NOTE — ED Triage Notes (Signed)
Pt states she has not had an appetite since yesterday. States she is able to keep food down but she just feels sick. Denies N/V/D/F. NAD

## 2017-02-06 NOTE — ED Notes (Signed)
Pt reports Last BM last night, normal. She is urinates w/o diff and is drinking plenty of fluids.

## 2017-02-06 NOTE — ED Notes (Signed)
Pt verbalized understanding of discharge instructions and denies any further questions at this time.   

## 2017-02-06 NOTE — ED Provider Notes (Signed)
[facility]   CSN: [id number] Arrival date & time: 02/06/17  0805     History   Chief Complaint Chief Complaint  Patient presents with  . Anorexia    HPI Kennis CarinaMikayla Matheney is a 26 y.o. female.  Patient is a healthy 26 year old female presenting today with a 2 day history of generalized malaise and nausea. She denies any localized abdominal pain, fever, cough, shortness of breath, chest pain or urinary complaints. Patient's last menstrual cycle was recently within the last week or 2 but she did miss her cycle in August. She states she did take multiple pregnancy tests including seeing her doctor who did a blood test and they were all negative. She denies any breast tenderness, headaches and no medications at this time.   The history is provided by the patient.    History reviewed. No pertinent past medical history.  There are no active problems to display for this patient.   Past Surgical History:  Procedure Laterality Date  . DILATION AND CURETTAGE OF UTERUS      OB History    Gravida Para Term Preterm AB Living   1 0 0   0     SAB TAB Ectopic Multiple Live Births                   Home Medications    Prior to Admission medications   Medication Sig Start Date End Date Taking? Authorizing Provider  gentamicin (GARAMYCIN) 0.3 % ophthalmic solution Place 1 drop into the right eye every 4 (four) hours. 10/15/16   Arthor CaptainHarris, Abigail, PA-C  ketotifen (ZADITOR) 0.025 % ophthalmic solution Place 1 drop into the right eye 2 (two) times daily. 10/15/16   Arthor CaptainHarris, Abigail, PA-C  metroNIDAZOLE (FLAGYL) 500 MG tablet Take 1 tablet (500 mg total) by mouth 2 (two) times daily. One po bid x 7 days 11/01/15   Nicanor AlconPalumbo, April, MD    Family History No family history on file.  Social History Social History  Substance Use Topics  . Smoking status: Never Smoker  . Smokeless tobacco: Never Used  . Alcohol use No     Allergies   Patient has no known  allergies.   Review of Systems Review of Systems  All other systems reviewed and are negative.    Physical Exam Updated Vital Signs BP 108/70 (BP Location: Left Arm)   Pulse 79   Temp 98.5 F (36.9 C) (Oral)   Resp 16   Ht 5\' 6"  (1.676 m)   Wt 106.1 kg (234 lb)   LMP 02/05/2017   SpO2 99%   BMI 37.77 kg/m   Physical Exam  Constitutional: She is oriented to person, place, and time. She appears well-developed and well-nourished. No distress.  HENT:  Head: Normocephalic and atraumatic.  Nose: Mucosal edema present.  Mouth/Throat: Oropharynx is clear and moist.  Eyes: Pupils are equal, round, and reactive to light. Conjunctivae and EOM are normal.  Neck: Normal range of motion. Neck supple.  Cardiovascular: Normal rate, regular rhythm and intact distal pulses.   No murmur heard. Pulmonary/Chest: Effort normal and breath sounds normal. No respiratory distress. She has no wheezes. She has no rales.  Abdominal: Soft. She exhibits no distension. There is no tenderness. There is no rebound and no guarding.  Musculoskeletal: Normal range of motion. She exhibits no edema or tenderness.  Neurological: She is alert and oriented to person, place, and time.  Skin: Skin is warm and dry. No rash noted.  No erythema.  Psychiatric: She has a normal mood and affect. Her behavior is normal.  Nursing note and vitals reviewed.    ED Treatments / Results  Labs (all labs ordered are listed, but only abnormal results are displayed) Labs Reviewed  PREGNANCY, URINE    EKG  EKG Interpretation None       Radiology No results found.  Procedures Procedures (including critical care time)  Medications Ordered in ED Medications - No data to display   Initial Impression / Assessment and Plan / ED Course  I have reviewed the triage vital signs and the nursing notes.  Pertinent labs & imaging results that were available during my care of the patient were reviewed by me and considered  in my medical decision making (see chart for details).     Patient with vague symptoms of nausea and anorexia over the last 2 days but no other localized symptoms. Vital signs are within normal limits and patient is otherwise well-appearing. She has no medications that could cause this. She recently had her period within the last week by his did miss her period in August.  She has taken multiple pregnancy tests which were all negative and does not think she is pregnant. Low suspicion for UTI or pelvic infection and she has no complaints. She has no abdominal pain on exam. Could be early viral cause. Urinary pregnancy test pending  9:37 AM Urine pregnancy test is negative. Also patient questioned about GERD type symptoms but she does have occasionally and this could be an early gastritis. Recommended diet changes and if symptoms not improved can try an over-the-counter antacid. Also could be a viral cause.  Final Clinical Impressions(s) / ED Diagnoses   Final diagnoses:  Nausea    New Prescriptions New Prescriptions   No medications on file     Gwyneth Sprout, MD 02/06/17 [phone]

## 2017-05-13 IMAGING — US US EXTREM LOW VENOUS BILAT
1 series · 13 of 24 positions shown · non-contrast
Comparison: None.

CLINICAL DATA: Chronic bilateral lower extremity swelling and calf
pain.



[Series 1: us extrem low venous bilat · 0.06mm/px · 13 of 64 slices shown]
[im 1/64]
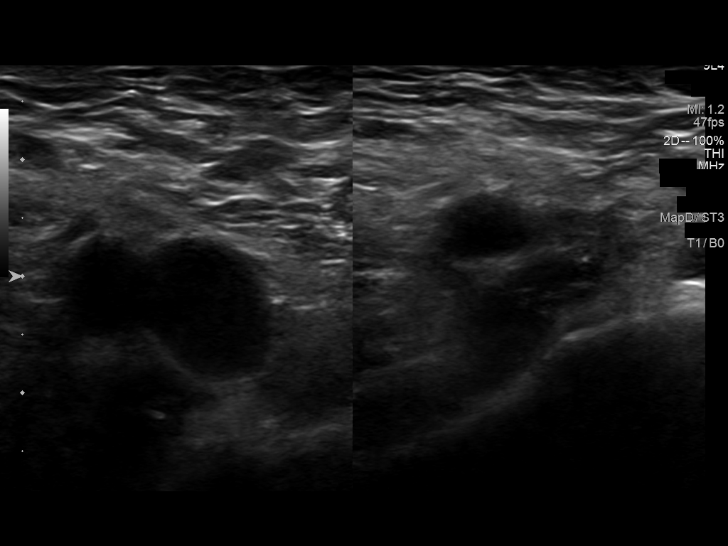
[im 6/64]
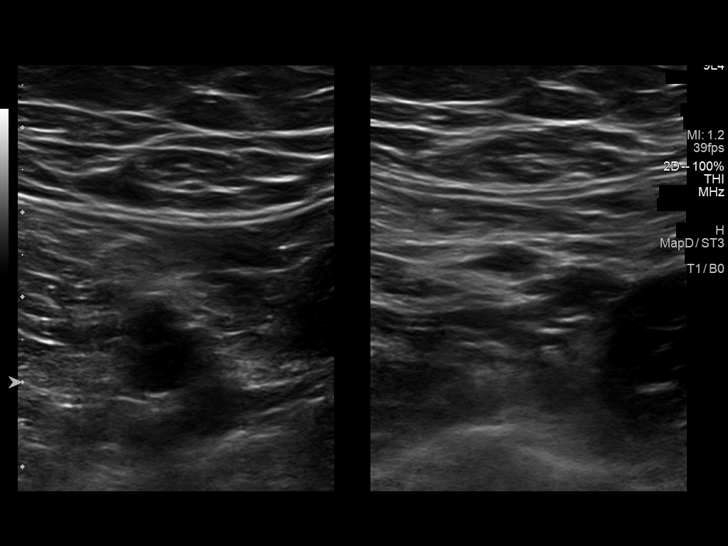
[im 11/64]
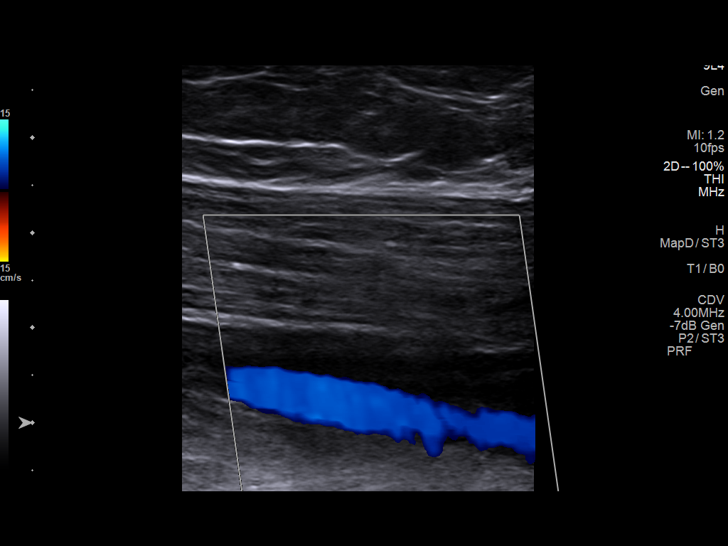
[im 17/64]
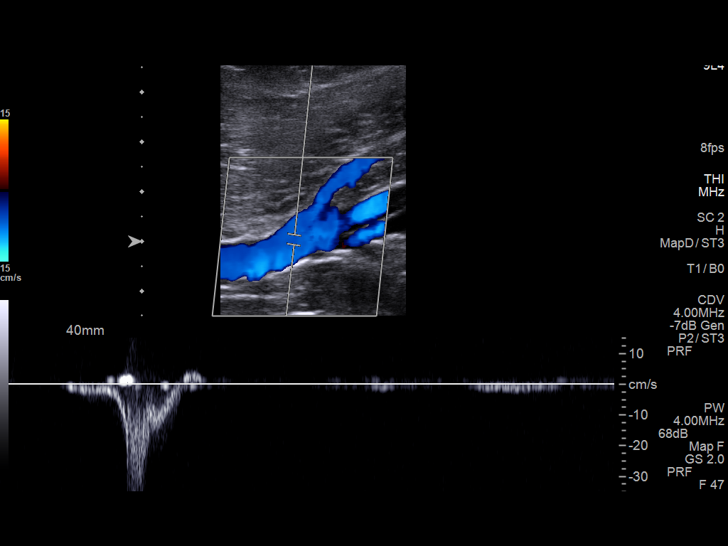
[im 22/64]
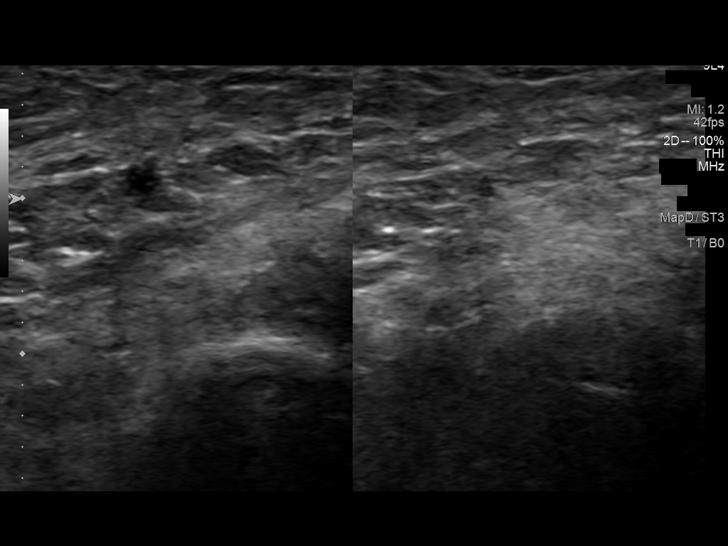
[im 28/64]
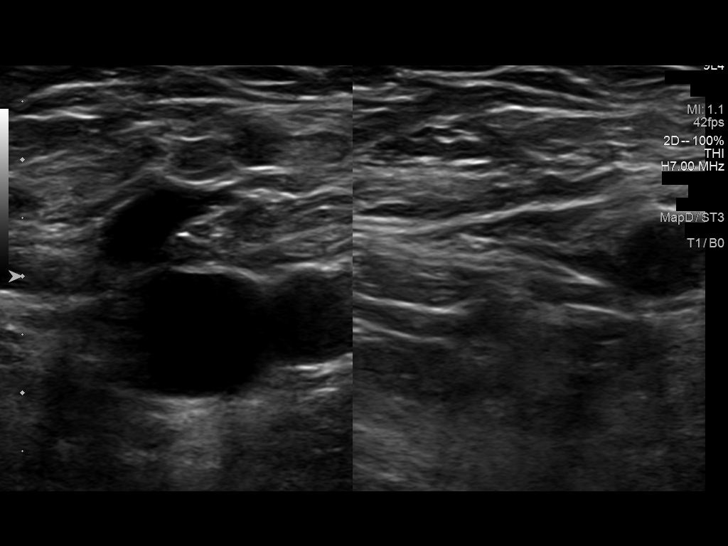
[im 33/64]
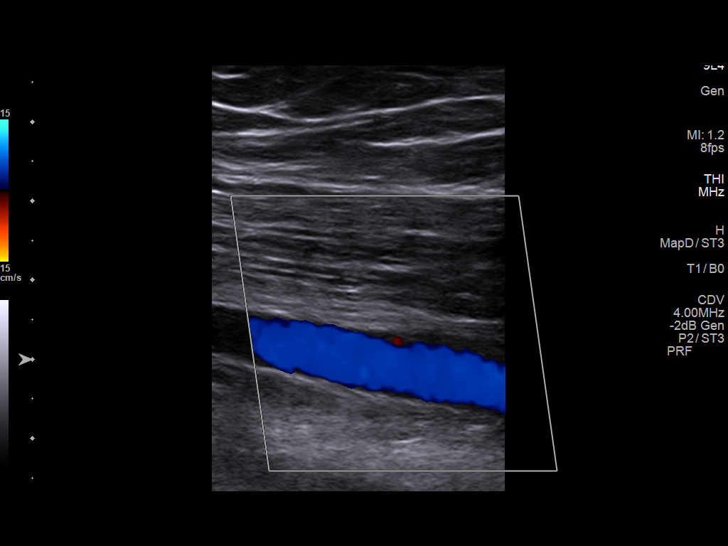
[im 36/64]
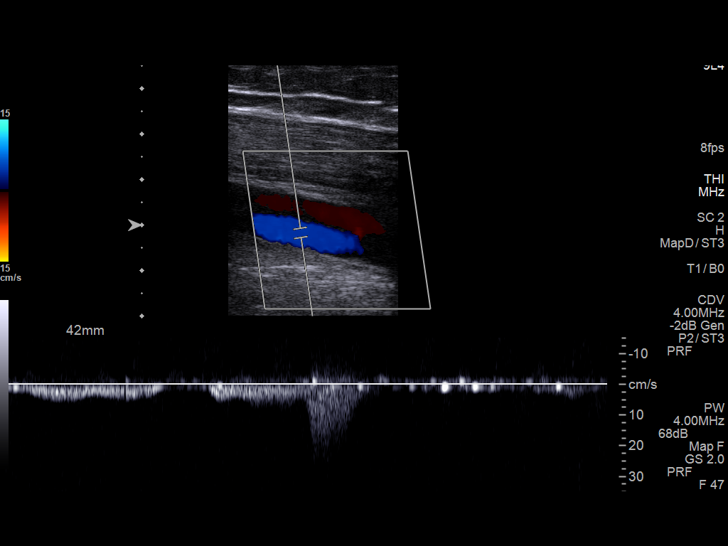
[im 42/64]
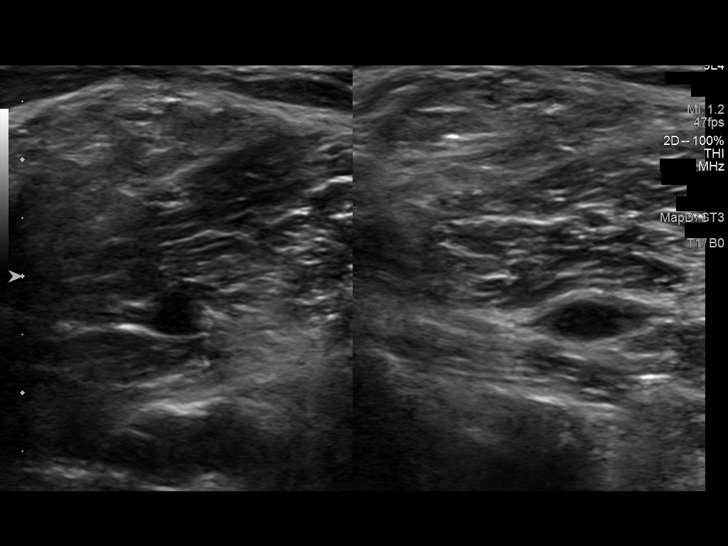
[im 47/64]
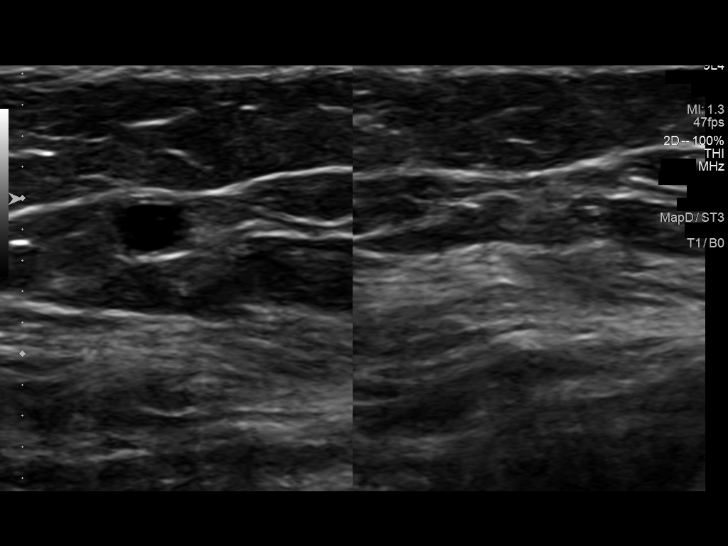
[im 53/64]
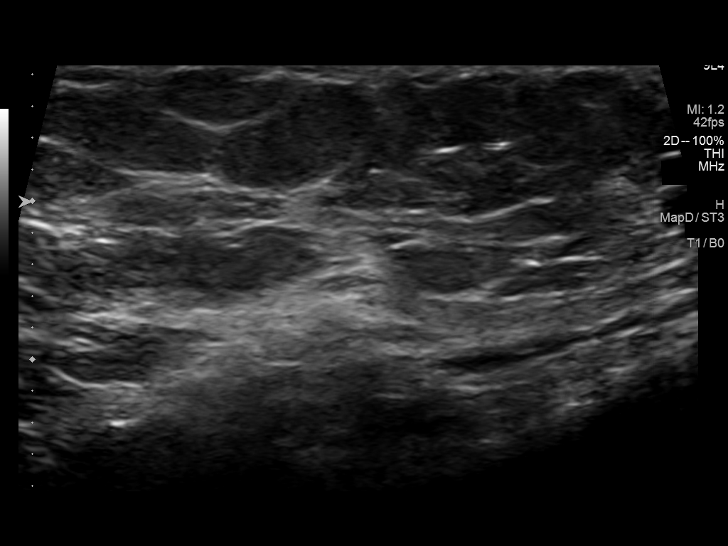
[im 58/64]
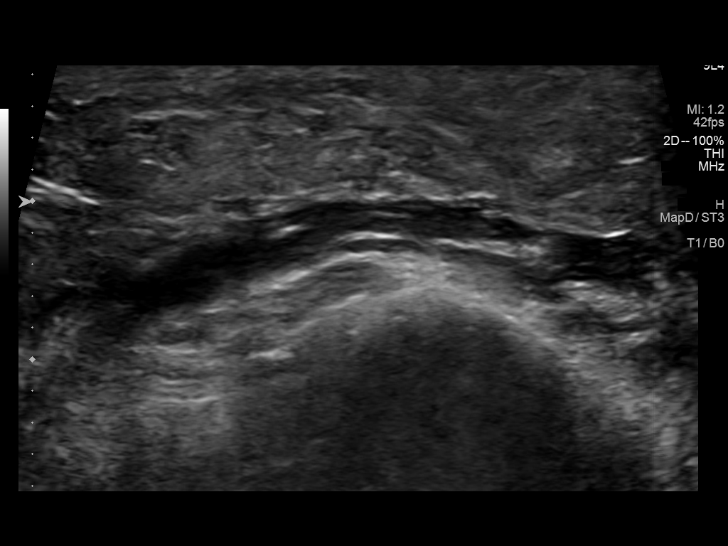
[im 64/64]
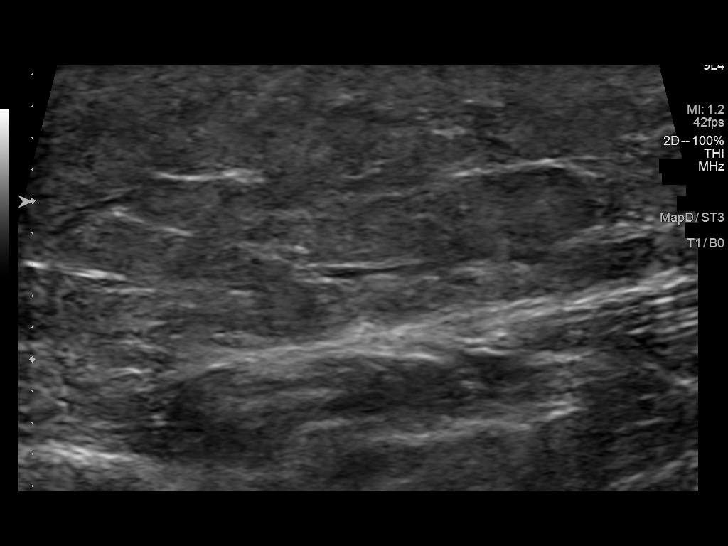

[13 of 24 positions shown; findings below may reference images not displayed]

FINDINGS: RIGHT LOWER EXTREMITY

Common Femoral Vein: No evidence of thrombus. Normal
compressibility, respiratory phasicity and response to augmentation.

Saphenofemoral Junction: No evidence of thrombus. Normal
compressibility and flow on color Doppler imaging.

Profunda Femoral Vein: No evidence of thrombus. Normal
compressibility and flow on color Doppler imaging.

Femoral Vein: No evidence of thrombus. Normal compressibility,
respiratory phasicity and response to augmentation.

Popliteal Vein: No evidence of thrombus. Normal compressibility,
respiratory phasicity and response to augmentation.

Calf Veins: No evidence of thrombus. Normal compressibility and flow
on color Doppler imaging.

Superficial Great Saphenous Vein: No evidence of thrombus. Normal
compressibility and flow on color Doppler imaging.

Venous Reflux:  None.

Other Findings:  None.

LEFT LOWER EXTREMITY

Common Femoral Vein: No evidence of thrombus. Normal
compressibility, respiratory phasicity and response to augmentation.

Saphenofemoral Junction: No evidence of thrombus. Normal
compressibility and flow on color Doppler imaging.

Profunda Femoral Vein: No evidence of thrombus. Normal
compressibility and flow on color Doppler imaging.

Femoral Vein: No evidence of thrombus. Normal compressibility,
respiratory phasicity and response to augmentation.

Popliteal Vein: No evidence of thrombus. Normal compressibility,
respiratory phasicity and response to augmentation.

Calf Veins: No evidence of thrombus. Normal compressibility and flow
on color Doppler imaging.

Superficial Great Saphenous Vein: No evidence of thrombus. Normal
compressibility and flow on color Doppler imaging.

Venous Reflux:  None.

Other Findings:  None.
IMPRESSION: No evidence of deep venous thrombosis.

## 2018-03-02 ENCOUNTER — Encounter (HOSPITAL_BASED_OUTPATIENT_CLINIC_OR_DEPARTMENT_OTHER): Payer: Self-pay | Admitting: *Deleted

## 2018-03-02 ENCOUNTER — Emergency Department (HOSPITAL_BASED_OUTPATIENT_CLINIC_OR_DEPARTMENT_OTHER)
Admission: EM | Admit: 2018-03-02 | Discharge: 2018-03-02 | Disposition: A | Discharge status: Home / Self Care | Payer: Medicaid Other | Attending: Emergency Medicine | Admitting: Emergency Medicine

## 2018-03-02 ENCOUNTER — Other Ambulatory Visit: Payer: Self-pay

## 2018-03-02 DIAGNOSIS — J029 Acute pharyngitis, unspecified: Secondary | ICD-10-CM | POA: Diagnosis not present

## 2018-03-02 DIAGNOSIS — Z87891 Personal history of nicotine dependence: Secondary | ICD-10-CM | POA: Diagnosis not present

## 2018-03-02 DIAGNOSIS — Z79899 Other long term (current) drug therapy: Secondary | ICD-10-CM | POA: Insufficient documentation

## 2018-03-02 DIAGNOSIS — R109 Unspecified abdominal pain: Secondary | ICD-10-CM | POA: Insufficient documentation

## 2018-03-02 DIAGNOSIS — J358 Other chronic diseases of tonsils and adenoids: Secondary | ICD-10-CM | POA: Insufficient documentation

## 2018-03-02 LAB — URINALYSIS, ROUTINE W REFLEX MICROSCOPIC
Bilirubin Urine: NEGATIVE
Glucose, UA: NEGATIVE mg/dL
Hgb urine dipstick: NEGATIVE
Ketones, ur: 15 mg/dL — AB
Nitrite: NEGATIVE
Protein, ur: NEGATIVE mg/dL
Specific Gravity, Urine: >1.03 — ABNORMAL HIGH (ref 1.005–1.030)
pH: 6 (ref 5.0–8.0)

## 2018-03-02 LAB — URINALYSIS, MICROSCOPIC (REFLEX): RBC / HPF: NONE SEEN RBC/hpf (ref 0–5)

## 2018-03-02 LAB — GROUP A STREP BY PCR: GROUP A STREP BY PCR: NOT DETECTED

## 2018-03-02 LAB — PREGNANCY, URINE: Preg Test, Ur: NEGATIVE

## 2018-03-02 NOTE — ED Notes (Signed)
PT REFUSING PELVIC EXAM. PA AT BEDSIDE.

## 2018-03-02 NOTE — ED Provider Notes (Signed)
[facility] EMERGENCY DEPARTMENT Provider Note   CSN: [id number] Arrival date & time: 03/02/18  1436     History   Chief Complaint Chief Complaint  Patient presents with  . Abdominal Pain    HPI Jody Henson is a 27 y.o. female who is previously healthy who presents with a 2-day history of sore throat and intermittent abdominal cramping.  She reports it feels like her menstrual cycle cramps.  She reports having unprotected intercourse over a week ago.  She took a pregnancy test at home which was negative.  She denies any nasal congestion, ear pain, fevers, abdominal pain otherwise.  She denies any nausea, vomiting, diarrhea, urinary symptoms.  She reports she did notice some brown discharge with wiping, but otherwise denies any abnormal vaginal bleeding or discharge.  LMP 02/08/2018.  She has no concern for STD exposure.  HPI  History reviewed. No pertinent past medical history.  There are no active problems to display for this patient.   Past Surgical History:  Procedure Laterality Date  . DENTAL SURGERY    . DILATION AND CURETTAGE OF UTERUS       OB History    Gravida  1   Para  0   Term  0   Preterm      AB  0   Living        SAB      TAB      Ectopic      Multiple      Live Births               Home Medications    Prior to Admission medications   Medication Sig Start Date End Date Taking? Authorizing Provider  gentamicin (GARAMYCIN) 0.3 % ophthalmic solution Place 1 drop into the right eye every 4 (four) hours. 10/15/16   Arthor Captain, PA-C  ketotifen (ZADITOR) 0.025 % ophthalmic solution Place 1 drop into the right eye 2 (two) times daily. 10/15/16   Arthor Captain, PA-C  metroNIDAZOLE (FLAGYL) 500 MG tablet Take 1 tablet (500 mg total) by mouth 2 (two) times daily. One po bid x 7 days 11/01/15   Nicanor Alcon, April, MD    Family History No family history on file.  Social History Social History   Tobacco Use  . Smoking status:  Former Games developer  . Smokeless tobacco: Never Used  Substance Use Topics  . Alcohol use: Not Currently  . Drug use: No     Allergies   Patient has no known allergies.   Review of Systems Review of Systems  Constitutional: Negative for chills and fever.  HENT: Positive for sore throat. Negative for facial swelling.   Respiratory: Negative for shortness of breath.   Cardiovascular: Negative for chest pain.  Gastrointestinal: Positive for abdominal pain. Negative for nausea and vomiting.  Genitourinary: Positive for vaginal discharge. Negative for dysuria.  Musculoskeletal: Negative for back pain.  Skin: Negative for rash and wound.  Neurological: Negative for headaches.  Psychiatric/Behavioral: The patient is not nervous/anxious.      Physical Exam Updated Vital Signs BP 125/79 (BP Location: Left Arm)   Pulse 83   Temp 98.4 F (36.9 C) (Oral)   Resp 18   Ht 5\' 6"  (1.676 m)   Wt 110.2 kg   LMP 02/08/2018   SpO2 100%   BMI 39.22 kg/m   Physical Exam  Constitutional: She appears well-developed and well-nourished. No distress.  HENT:  Head: Normocephalic and atraumatic.  Mouth/Throat: Oropharynx is clear  and moist. No oropharyngeal exudate. Tonsils are 2+ on the right. Tonsils are 2+ on the left.  Tonsillolith noted to the left tonsil, ~3x76mm; no significant erythema or edema to the tonsils  Eyes: Pupils are equal, round, and reactive to light. Conjunctivae are normal. Right eye exhibits no discharge. Left eye exhibits no discharge. No scleral icterus.  Neck: Normal range of motion. Neck supple. No thyromegaly present.  Cardiovascular: Normal rate, regular rhythm, normal heart sounds and intact distal pulses. Exam reveals no gallop and no friction rub.  No murmur heard. Pulmonary/Chest: Effort normal and breath sounds normal. No stridor. No respiratory distress. She has no wheezes. She has no rales.  Abdominal: Soft. Bowel sounds are normal. She exhibits no distension. There  is no tenderness. There is no rebound and no guarding.  Musculoskeletal: She exhibits no edema.  Lymphadenopathy:    She has no cervical adenopathy.  Neurological: She is alert. Coordination normal.  Skin: Skin is warm and dry. No rash noted. She is not diaphoretic. No pallor.  Psychiatric: She has a normal mood and affect.  Nursing note and vitals reviewed.    ED Treatments / Results  Labs (all labs ordered are listed, but only abnormal results are displayed) Labs Reviewed  URINALYSIS, ROUTINE W REFLEX MICROSCOPIC - Abnormal; Notable for the following components:      Result Value   Specific Gravity, Urine >1.030 (*)    Ketones, ur 15 (*)    Leukocytes, UA TRACE (*)    All other components within normal limits  URINALYSIS, MICROSCOPIC (REFLEX) - Abnormal; Notable for the following components:   Bacteria, UA FEW (*)    All other components within normal limits  GROUP A STREP BY PCR  PREGNANCY, URINE    EKG None  Radiology No results found.  Procedures Procedures (including critical care time)  Medications Ordered in ED Medications - No data to display   Initial Impression / Assessment and Plan / ED Course  I have reviewed the triage vital signs and the nursing notes.  Pertinent labs & imaging results that were available during my care of the patient were reviewed by me and considered in my medical decision making (see chart for details).     Patient with tonsillolith.  Strep is negative.  Urine pregnancy negative.  UA shows trace leukocytes, few bacteria, suspect sample.  Patient declines pelvic exam and will follow-up with OB/GYN if discharge persists.  She has no abdominal tenderness.  Will refer to ENT as needed for tonsillitis.  Suggest warm salt water gargles.  Strict return precautions given.  Patient understands and agrees with plan.  Patient vitals stable throughout ED course and discharged in satisfactory condition.  Final Clinical Impressions(s) / ED  Diagnoses   Final diagnoses:  Abdominal cramping  Sore throat  [facility]    ED Discharge Orders    None       Emi Holes, PA-C 03/02/18 1613    Gwyneth Sprout, MD 03/03/18 2154

## 2018-03-02 NOTE — ED Triage Notes (Signed)
Pt c/o stomach cramping and sore throat (with swallowing) x 1 week

## 2018-03-02 NOTE — Discharge Instructions (Addendum)
Use warm salt water gargles at home to help remove the stone.  You can follow-up with the ear, nose, throat doctor if it becomes more bothersome and is not falling out on its own.  Please return the emergency department if you develop any new or worsening symptoms.  Please follow-up with your OB/GYN if your vaginal discharge continues.

## 2019-12-14 ENCOUNTER — Other Ambulatory Visit: Payer: Self-pay

## 2019-12-14 ENCOUNTER — Encounter (HOSPITAL_BASED_OUTPATIENT_CLINIC_OR_DEPARTMENT_OTHER): Payer: Self-pay | Admitting: Emergency Medicine

## 2019-12-14 ENCOUNTER — Emergency Department (HOSPITAL_BASED_OUTPATIENT_CLINIC_OR_DEPARTMENT_OTHER)
Admission: EM | Admit: 2019-12-14 | Discharge: 2019-12-14 | Disposition: A | Discharge status: Home / Self Care | Payer: Medicaid Other | Attending: Emergency Medicine | Admitting: Emergency Medicine

## 2019-12-14 ENCOUNTER — Emergency Department (HOSPITAL_BASED_OUTPATIENT_CLINIC_OR_DEPARTMENT_OTHER): Payer: Medicaid Other

## 2019-12-14 DIAGNOSIS — R002 Palpitations: Secondary | ICD-10-CM | POA: Insufficient documentation

## 2019-12-14 DIAGNOSIS — R0602 Shortness of breath: Secondary | ICD-10-CM | POA: Diagnosis not present

## 2019-12-14 DIAGNOSIS — F17290 Nicotine dependence, other tobacco product, uncomplicated: Secondary | ICD-10-CM | POA: Insufficient documentation

## 2019-12-14 DIAGNOSIS — R0789 Other chest pain: Secondary | ICD-10-CM | POA: Diagnosis present

## 2019-12-14 DIAGNOSIS — I10 Essential (primary) hypertension: Secondary | ICD-10-CM | POA: Insufficient documentation

## 2019-12-14 DIAGNOSIS — Z79899 Other long term (current) drug therapy: Secondary | ICD-10-CM | POA: Insufficient documentation

## 2019-12-14 HISTORY — DX: Essential (primary) hypertension: I10

## 2019-12-14 LAB — CBC WITH DIFFERENTIAL/PLATELET
Abs Immature Granulocytes: 0.03 10*3/uL (ref 0.00–0.07)
Basophils Absolute: 0 10*3/uL (ref 0.0–0.1)
Basophils Relative: 1 %
Eosinophils Absolute: 0.1 10*3/uL (ref 0.0–0.5)
Eosinophils Relative: 2 %
HCT: 38.7 % (ref 36.0–46.0)
Hemoglobin: 12.8 g/dL (ref 12.0–15.0)
Immature Granulocytes: 1 %
Lymphocytes Relative: 38 %
Lymphs Abs: 2.3 10*3/uL (ref 0.7–4.0)
MCH: 29.2 pg (ref 26.0–34.0)
MCHC: 33.1 g/dL (ref 30.0–36.0)
MCV: 88.2 fL (ref 80.0–100.0)
Monocytes Absolute: 0.5 10*3/uL (ref 0.1–1.0)
Monocytes Relative: 8 %
Neutro Abs: 3.1 10*3/uL (ref 1.7–7.7)
Neutrophils Relative %: 50 %
Platelets: 257 10*3/uL (ref 150–400)
RBC: 4.39 MIL/uL (ref 3.87–5.11)
RDW: 13.2 % (ref 11.5–15.5)
WBC: 6.1 10*3/uL (ref 4.0–10.5)
nRBC: 0 % (ref 0.0–0.2)

## 2019-12-14 LAB — COMPREHENSIVE METABOLIC PANEL
ALT: 11 U/L (ref 0–44)
AST: 14 U/L — ABNORMAL LOW (ref 15–41)
Albumin: 3.7 g/dL (ref 3.5–5.0)
Alkaline Phosphatase: 59 U/L (ref 38–126)
Anion gap: 10 (ref 5–15)
BUN: 18 mg/dL (ref 6–20)
CO2: 25 mmol/L (ref 22–32)
Calcium: 8.6 mg/dL — ABNORMAL LOW (ref 8.9–10.3)
Chloride: 104 mmol/L (ref 98–111)
Creatinine, Ser: 0.92 mg/dL (ref 0.44–1.00)
GFR calc Af Amer: >60 mL/min (ref >60)
GFR calc non Af Amer: >60 mL/min (ref >60)
Glucose, Bld: 84 mg/dL (ref 70–99)
Potassium: 4 mmol/L (ref 3.5–5.1)
Sodium: 139 mmol/L (ref 135–145)
Total Bilirubin: 0.4 mg/dL (ref 0.3–1.2)
Total Protein: 6.8 g/dL (ref 6.5–8.1)

## 2019-12-14 LAB — BRAIN NATRIURETIC PEPTIDE: B Natriuretic Peptide: 29.1 pg/mL (ref 0.0–100.0)

## 2019-12-14 LAB — TROPONIN I (HIGH SENSITIVITY)
Troponin I (High Sensitivity): <2 ng/L (ref <18)
Troponin I (High Sensitivity): <2 ng/L (ref <18)

## 2019-12-14 LAB — PREGNANCY, URINE: Preg Test, Ur: NEGATIVE

## 2019-12-14 NOTE — ED Provider Notes (Signed)
[facility] EMERGENCY DEPARTMENT Provider Note   CSN: [id number] Arrival date & time: 12/14/19  1725     History Chief Complaint  Patient presents with  . Chest Pain    Jody Henson is a 29 y.o. female with a past medical history of hypertension who presents today for evaluation of intermittent chest tightness.  She states that about 5-10 times an hour she feels this abnormal fluttering/tightness sensation that lasts for under 5 seconds and then goes away.  She reports when it happened she does feel a little short of breath however when she is not having that feeling her breathing is normal.  She reports that she has chronic, unchanged leg swelling.  This has been present since at least 2016.  She denies fevers.  No hormone use.  No personal history of DVT/PE.  No family history of MI before the age of 46, sudden cardiac death, or coagulopathies.  She is not vaccinated against Covid, reports she wears her mask and works from home.    She reports that today she had a Styrofoam cup full of Pepsi, a 16 ounce bottle of Pepsi, and a can of cheer wine.  She does not normally drink sodas however notes these symptoms started before the soda.  She states that she would normally get a large ice coffee from [employer] on days she works however she did not have any coffee today.  She denies any other stimulants.  No recreational drug use.  She denies any history of asthma.  HPI     Past Medical History:  Diagnosis Date  . Hypertension     There are no problems to display for this patient.   Past Surgical History:  Procedure Laterality Date  . DENTAL SURGERY    . DILATION AND CURETTAGE OF UTERUS       OB History    Gravida  1   Para  0   Term  0   Preterm      AB  0   Living        SAB      TAB      Ectopic      Multiple      Live Births              No family history on file.  Social History   Tobacco Use  . Smoking status: Current Every Day  Smoker    Types: Cigars  . Smokeless tobacco: Never Used  Vaping Use  . Vaping Use: Never used  Substance Use Topics  . Alcohol use: Yes  . Drug use: No    Home Medications Prior to Admission medications   Medication Sig Start Date End Date Taking? Authorizing Provider  gentamicin (GARAMYCIN) 0.3 % ophthalmic solution Place 1 drop into the right eye every 4 (four) hours. 10/15/16   Arthor Captain, PA-C  ketotifen (ZADITOR) 0.025 % ophthalmic solution Place 1 drop into the right eye 2 (two) times daily. 10/15/16   Arthor Captain, PA-C  metroNIDAZOLE (FLAGYL) 500 MG tablet Take 1 tablet (500 mg total) by mouth 2 (two) times daily. One po bid x 7 days 11/01/15   Nicanor Alcon, April, MD    Allergies    Patient has no known allergies.  Review of Systems   Review of Systems  Constitutional: Negative for chills and fever.  Respiratory: Positive for shortness of breath. Negative for chest tightness.   Cardiovascular: Positive for palpitations and leg swelling (Chronic, unchanged).  Neurological:  Negative for weakness.  All other systems reviewed and are negative.   Physical Exam Updated Vital Signs BP 97/67   Pulse 80   Temp 98.9 F (37.2 C) (Oral)   Resp (!) 22   Ht 5\' 6"  (1.676 m)   Wt 119.3 kg   LMP 11/26/2019   SpO2 99%   BMI 42.45 kg/m   Physical Exam Vitals and nursing note reviewed.  Constitutional:      General: She is not in acute distress.    Appearance: She is well-developed. She is not diaphoretic.  HENT:     Head: Normocephalic and atraumatic.  Eyes:     General: No scleral icterus.       Right eye: No discharge.        Left eye: No discharge.     Conjunctiva/sclera: Conjunctivae normal.  Cardiovascular:     Rate and Rhythm: Normal rate and regular rhythm.     Pulses:          Dorsalis pedis pulses are 2+ on the right side and 2+ on the left side.       Posterior tibial pulses are 2+ on the right side and 2+ on the left side.     Heart sounds: Normal heart  sounds. No murmur heard.   Pulmonary:     Effort: Pulmonary effort is normal. No respiratory distress.     Breath sounds: Normal breath sounds. No stridor. No decreased breath sounds.  Chest:     Chest wall: No mass or tenderness.  Abdominal:     General: There is no distension.     Palpations: Abdomen is soft.     Tenderness: There is no abdominal tenderness.  Musculoskeletal:        General: No deformity.     Cervical back: Normal range of motion.     Right lower leg: No tenderness. Edema present.     Left lower leg: No tenderness. Edema present.     Comments: Bilateral 2+ pitting edema  Skin:    General: Skin is warm and dry.  Neurological:     General: No focal deficit present.     Mental Status: She is alert.     Cranial Nerves: No cranial nerve deficit.     Motor: No abnormal muscle tone.  Psychiatric:        Behavior: Behavior normal.     ED Results / Procedures / Treatments   Labs (all labs ordered are listed, but only abnormal results are displayed) Labs Reviewed  CBC WITH DIFFERENTIAL/PLATELET  PREGNANCY, URINE  COMPREHENSIVE METABOLIC PANEL  BRAIN NATRIURETIC PEPTIDE  TROPONIN I (HIGH SENSITIVITY)    EKG EKG Interpretation  Date/Time:  Sunday December 14 2019 17:35:57 EDT Ventricular Rate:  79 PR Interval:    QRS Duration: 90 QT Interval:  363 QTC Calculation: 417 R Axis:   62 Text Interpretation: Sinus rhythm Normal ECG No previous tracing Confirmed by [id number] (Gwyneth Sprout) on 12/14/2019 5:42:14 PM   Radiology DG Chest 2 View  Result Date: 12/14/2019 CLINICAL DATA:  C/o mid chest tightness off and on since this morning. Smoker EXAM: CHEST - 2 VIEW COMPARISON:  None. FINDINGS: The heart size and mediastinal contours are within normal limits. The lungs are clear. No pneumothorax or pleural effusion. The visualized skeletal structures are unremarkable. IMPRESSION: No active cardiopulmonary disease. Electronically Signed   By: 12/16/2019 M.D.    On: 12/14/2019 18:14    Procedures Procedures (including critical care time)  Medications  Ordered in ED Medications - No data to display  ED Course  I have reviewed the triage vital signs and the nursing notes.  Pertinent labs & imaging results that were available during my care of the patient were reviewed by me and considered in my medical decision making (see chart for details).    MDM Rules/Calculators/A&P                         Patient is a 29 year old woman who presents today for evaluation of what she initially reported as chest pain, however after further discussion relates this is palpitations.  While I was in the room and she was on cardiac monitoring I would noted that she would have PVCs and this correlated with when she felt her chest discomfort/palpitation.  This appears to be symptomatic PVCs.  With that in mind on my care she is hemodynamically stable, afebrile not tachycardic, tachypneic, or hypoxic.  She does admit that she drinks a fair amount of soda today compared to her usual baseline however says the palpitations started before that.  She is not vaccinated against Covid.  Plan to obtain baseline labs to evaluate for significant electrolyte or hematologic derangements.  Chest x-ray is ordered without evidence of consolidation, pneumothorax, or other abnormalities.  She does have bilateral leg edema, however this is being unchanged and is her normal baseline.  Chart review shows that she was seen for this at least as far back as 2016, she states that the edema is normally slightly worse on the right and is her baseline.  She does not take any hormones, no recent surgery, trauma, or hemoptysis.  Given that she reports her leg swelling is at its usual baseline and is bilateral, not unilateral, she is PERC negative.  Additionally low suspicion for PE causing palpitations.  At shift change care was transferred to [facility] who will follow pending studies,  re-evaulate and determine disposition.     Final Clinical Impression(s) / ED Diagnoses Final diagnoses:  Palpitations    Rx / DC Orders ED Discharge Orders    None       Norman Clay 12/14/19 1850    Gwyneth Sprout, MD 12/15/19 2124

## 2019-12-14 NOTE — ED Notes (Signed)
Patient transported to X-ray 

## 2019-12-14 NOTE — ED Triage Notes (Signed)
Intermittent chest tightness today. Denies recent illness.

## 2019-12-14 NOTE — Discharge Instructions (Addendum)
You need to stop drinking caffeine as this can irritate your heart.  You notice you are getting headaches from not drinking caffeine and you may need to slowly wean yourself off caffeine. All of your labs were reassuring today. I have included the number for the cardiologist. Please call tomorrow to schedule an appointment for further evaluation. Return to the ER for new or worsening symptoms.

## 2019-12-14 NOTE — ED Provider Notes (Signed)
Care assumed from Jody Safe, PA-C at shift change pending labs.  See her note for full HPI.  In short, patient is a 29 year old female with a past medical history significant for hypertension presents to the ED due to intermittent palpitations associated with chest tightness. Patient notes chest tightness does not feel like pain. Patient described palpitations as a fluttering sensation that lasts roughly 5 seconds and then go away. She notes during each episode it becomes slightly harder to breath.  Denies history of blood clots, recent surgeries, recent long immobilizations, hormonal treatments, and hemoptysis.  Patient admits to having chronic lower extremity edema which has been unchanged.  Previous provider noted patient to be throwing PVCs while in the room on the cardiac monitor which is when patient noted to feel the abnormal fluttering/chest tightness.  Previous provider not concerned about PE/DVT it is more palpitations that are uncomfortable, not specifically chest pain  Plan from previous provider: If labs are unremarkable, patient may be discharged home with cardiology follow-up. Physical Exam  BP 136/79 (BP Location: Right Arm)   Pulse 87   Temp 98.9 F (37.2 C) (Oral)   Resp 18   Ht 5\' 6"  (1.676 m)   Wt 119.3 kg   LMP 11/26/2019   SpO2 99%   BMI 42.45 kg/m   Physical Exam Vitals and nursing note reviewed.  Constitutional:      General: She is not in acute distress.    Appearance: She is not ill-appearing.  HENT:     Head: Normocephalic.  Eyes:     Pupils: Pupils are equal, round, and reactive to light.  Cardiovascular:     Rate and Rhythm: Normal rate and regular rhythm.     Pulses: Normal pulses.     Heart sounds: Normal heart sounds. No murmur heard.  No friction rub. No gallop.   Pulmonary:     Effort: Pulmonary effort is normal.     Breath sounds: Normal breath sounds.  Abdominal:     General: Abdomen is flat. There is no distension.     Palpations:  Abdomen is soft.     Tenderness: There is no abdominal tenderness. There is no guarding or rebound.  Musculoskeletal:     Cervical back: Neck supple.     Comments: Pitting edema bilaterally. No calf tenderness. Negative homan sign bilaterally.   Skin:    General: Skin is warm and dry.  Neurological:     General: No focal deficit present.     Mental Status: She is alert.  Psychiatric:        Mood and Affect: Mood normal.        Behavior: Behavior normal.     ED Course/Procedures   Clinical Course as of Dec 14 1907  Sun Dec 14, 2019  1905 Troponin I (High Sensitivity): <2 [CA]  1905 B Natriuretic Peptide: 29.1 [CA]    Clinical Course User Index [CA] Dec 16, 2019, PA-C    Procedures  MDM  Care assumed from Mannie Stabile, PA-C at shift change pending labs.  See her note for full MDM.  29 year old female presents to the ED due to palpitations associated with "chest tightness".  Patient continuously corrects me when I say chest pain and notes it is not painful, but abnormal feeling.  Upon arrival, vitals all within normal limits.  Patient is afebrile, not tachycardic or hypoxic.  Patient no acute distress and non-ill-appearing.  Physical exam reassuring.  Lungs clear to auscultation bilaterally.  Pitting edema bilaterally.  No calf tenderness.  Negative Homans' sign bilaterally.  Patient notes lower extremity edema has not changed in character over the past few weeks.  Chart reviewed.  Patient has been worked up numerous times for lower extremity edema with reassuring work-up.   CBC unremarkable with no leukocytosis and normal hemoglobin.  BNP normal.  Doubt congestive heart failure as an etiology of lower extremity edema.  Initial troponin normal. Will obtain delta troponin to rule out ACS. CMP reassuring with normal renal function no major electrolyte derangements.  Chest x-ray personally reviewed which is negative for signs of pneumonia, pneumothorax, or widened mediastinum.   EKG personally reviewed which demonstrates normal sinus rhythm with no signs of acute ischemia.  7:10 PM reassessed patient at bedside. Patient throwing intermittent PVCs which is when she notes the palpitation and abnormal feeling in her chest.   PERC negative and low risk using Wells criteria.  Low suspicion for PE/DVT.  Suspect patient's palpitations related to PVCs that continuously appear on cardiac monitoring.  Delta troponin flat.  Low suspicion for ACS.  Discussed with patient about her PVCs.  Cardiology number given to patient discharge.  Instructed patient to call the cardiologist tomorrow to schedule point for further evaluation.  Advised patient to limit the amount of caffeine she is drinking. Strict ED precautions discussed with patient. Patient states understanding and agrees to plan. Patient discharged home in no acute distress and stable vitals.    Jesusita Oka 12/14/19 2151    Gwyneth Sprout, MD 12/15/19 2123

## 2019-12-14 NOTE — Progress Notes (Signed)
Heart rate of 152 at 20:30 validated in error.

## 2021-10-05 ENCOUNTER — Other Ambulatory Visit: Payer: Self-pay

## 2021-10-05 ENCOUNTER — Emergency Department (HOSPITAL_BASED_OUTPATIENT_CLINIC_OR_DEPARTMENT_OTHER)
Admission: EM | Admit: 2021-10-05 | Discharge: 2021-10-05 | Disposition: A | Discharge status: Home / Self Care | Payer: Medicaid Other | Attending: Emergency Medicine | Admitting: Emergency Medicine

## 2021-10-05 ENCOUNTER — Encounter (HOSPITAL_BASED_OUTPATIENT_CLINIC_OR_DEPARTMENT_OTHER): Payer: Self-pay | Admitting: Emergency Medicine

## 2021-10-05 DIAGNOSIS — R0602 Shortness of breath: Secondary | ICD-10-CM | POA: Insufficient documentation

## 2021-10-05 DIAGNOSIS — R0789 Other chest pain: Secondary | ICD-10-CM | POA: Insufficient documentation

## 2021-10-05 DIAGNOSIS — R002 Palpitations: Secondary | ICD-10-CM | POA: Diagnosis present

## 2021-10-05 HISTORY — DX: Unspecified maternal hypertension, unspecified trimester: O16.9

## 2021-10-05 NOTE — Discharge Instructions (Addendum)
We signed the ER for heart palpitations and chest discomfort and shortness of breath. ? ?You are feeling a lot better when we saw you.  Your EKG is reassuring.  Your cardiac monitoring has remained normal. ? ?Since you continue to feel well, we feel safe discharging you.  Please follow-up with your primary care doctor. ?

## 2021-10-05 NOTE — ED Triage Notes (Signed)
Pt reports CP that started this morning; pain is difficult to describe; sts feels like she is breathing faster than normal (no resp difficulty noted); was checked out by EMS before coming here ?

## 2021-10-05 NOTE — ED Provider Notes (Signed)
?[facility] EMERGENCY DEPARTMENT ?Provider Note ? ? ?CSN: [id number] ?Arrival date & time: 10/05/21  0907 ? ?  ? ?History ? ?Chief Complaint  ?Patient presents with  ? Chest Pain  ? ? ?Jody Henson is a 31 y.o. female. ? ?HPI ? ?  ?31 year old female comes in with chief complaint of chest pain, palpitations.  Patient indicates that within few minutes of eating her breakfast, she started having chest tightness and palpitations.  It felt like her heart was fluttering.  She has some shortness of breath.  She started getting anxious and pacing around her room.  EMS checked her out before she decided to come in.  While in the ED, she is feeling better and back to normal.  She has no history of similar symptoms.  She does not have any history of anxiety.  There is no specific trigger to her symptoms.  Patient denies substance use disorder. Pt has no hx of PE, DVT and denies any exogenous hormone (testosterone / estrogen) use, long distance travels or surgery in the past 6 weeks, active cancer, recent immobilization. ? ? ?Home Medications ?Prior to Admission medications   ?Medication Sig Start Date End Date Taking? Authorizing Provider  ?gentamicin (GARAMYCIN) 0.3 % ophthalmic solution Place 1 drop into the right eye every 4 (four) hours. 10/15/16   Arthor Captain, PA-C  ?ketotifen (ZADITOR) 0.025 % ophthalmic solution Place 1 drop into the right eye 2 (two) times daily. 10/15/16   Arthor Captain, PA-C  ?metroNIDAZOLE (FLAGYL) 500 MG tablet Take 1 tablet (500 mg total) by mouth 2 (two) times daily. One po bid x 7 days 11/01/15   Nicanor Alcon, April, MD  ?   ? ?Allergies    ?Patient has no known allergies.   ? ?Review of Systems   ?Review of Systems  ?All other systems reviewed and are negative. ? ?Physical Exam ?Updated Vital Signs ?BP 120/80   Pulse 79   Temp 98.4 ?F (36.9 ?C) (Oral)   Resp 16   Ht 5\' 6"  (1.676 m)   Wt 107.5 kg   LMP 09/10/2021   SpO2 100%   BMI 38.25 kg/m?  ?Physical Exam ?Vitals and nursing  note reviewed.  ?Constitutional:   ?   Appearance: She is well-developed.  ?HENT:  ?   Head: Atraumatic.  ?Cardiovascular:  ?   Rate and Rhythm: Normal rate.  ?Pulmonary:  ?   Effort: Pulmonary effort is normal.  ?Musculoskeletal:  ?   Cervical back: Normal range of motion and neck supple.  ?Skin: ?   General: Skin is warm and dry.  ?Neurological:  ?   Mental Status: She is alert and oriented to person, place, and time.  ? ? ?ED Results / Procedures / Treatments   ?Labs ?(all labs ordered are listed, but only abnormal results are displayed) ?Labs Reviewed - No data to display ? ?EKG ?EKG Interpretation ? ?Date/Time:  Wednesday Oct 05 2021 09:15:50 EDT ?Ventricular Rate:  84 ?PR Interval:  136 ?QRS Duration: 74 ?QT Interval:  358 ?QTC Calculation: 423 ?R Axis:   1 ?Text Interpretation: Normal sinus rhythm with sinus arrhythmia Normal ECG When compared with ECG of 14-Dec-2019 17:35, PREVIOUS ECG IS PRESENT No acute changes No significant change since last tracing Confirmed by 16-Dec-[phone]) on 10/05/2021 11:24:07 AM ? ?Radiology ?No results found. ? ?Procedures ?Procedures  ? ? ?Medications Ordered in ED ?Medications - No data to display ? ?ED Course/ Medical Decision Making/ A&P ?  ?                        ?  Medical Decision Making ? ?31 year old female comes in with chief complaint of chest discomfort, shortness of breath and palpitations. ? ?She is noted to be in sinus rhythm and comfortable during our assessment. ?Normal cardiovascular exam. ? ?Differential diagnosis includes PSVT A-fib-flutter, anxiety. ? ?Patient has no concerning social history, substance use disorder history and she is not on any medications.  She is PERC negative and has Wells score of 0, no PE work-up needed.  Symptoms not consistent with ACS, screening EKG to be ordered, but no other lab work-up indicated. ? ?Patient to be monitored on telemetry for short time.  If her symptoms remain stable then she can be discharged with outpatient  follow-up.  If she starts having repeat episodes then we will initiate lab work-up and further investigation.  Patient is comfortable with the plan. ? ?Final Clinical Impression(s) / ED Diagnoses ?Final diagnoses:  ?Heart palpitations  ? ? ?Rx / DC Orders ?ED Discharge Orders   ? ? None  ? ?  ? ? ?  ?Derwood Kaplan, MD ?10/05/21 1128 ? ?

## 2022-02-21 ENCOUNTER — Emergency Department (HOSPITAL_BASED_OUTPATIENT_CLINIC_OR_DEPARTMENT_OTHER): Payer: Medicaid Other

## 2022-02-21 ENCOUNTER — Encounter (HOSPITAL_BASED_OUTPATIENT_CLINIC_OR_DEPARTMENT_OTHER): Payer: Self-pay | Admitting: Emergency Medicine

## 2022-02-21 ENCOUNTER — Inpatient Hospital Stay (HOSPITAL_BASED_OUTPATIENT_CLINIC_OR_DEPARTMENT_OTHER)
Admission: EM | Admit: 2022-02-21 | Discharge: 2022-02-23 | DRG: 270 | Disposition: A | Discharge status: Home / Self Care | Payer: Medicaid Other | Attending: Internal Medicine | Admitting: Internal Medicine

## 2022-02-21 ENCOUNTER — Other Ambulatory Visit: Payer: Self-pay

## 2022-02-21 ENCOUNTER — Encounter (HOSPITAL_COMMUNITY): Payer: Self-pay

## 2022-02-21 DIAGNOSIS — I82422 Acute embolism and thrombosis of left iliac vein: Secondary | ICD-10-CM | POA: Diagnosis not present

## 2022-02-21 DIAGNOSIS — E669 Obesity, unspecified: Secondary | ICD-10-CM | POA: Diagnosis present

## 2022-02-21 DIAGNOSIS — I82462 Acute embolism and thrombosis of left calf muscular vein: Secondary | ICD-10-CM | POA: Diagnosis present

## 2022-02-21 DIAGNOSIS — I2699 Other pulmonary embolism without acute cor pulmonale: Secondary | ICD-10-CM | POA: Diagnosis present

## 2022-02-21 DIAGNOSIS — I2694 Multiple subsegmental pulmonary emboli without acute cor pulmonale: Secondary | ICD-10-CM | POA: Diagnosis present

## 2022-02-21 DIAGNOSIS — I82220 Acute embolism and thrombosis of inferior vena cava: Secondary | ICD-10-CM | POA: Diagnosis present

## 2022-02-21 DIAGNOSIS — Z96662 Presence of left artificial ankle joint: Secondary | ICD-10-CM | POA: Diagnosis present

## 2022-02-21 DIAGNOSIS — I82409 Acute embolism and thrombosis of unspecified deep veins of unspecified lower extremity: Secondary | ICD-10-CM | POA: Diagnosis present

## 2022-02-21 DIAGNOSIS — I708 Atherosclerosis of other arteries: Secondary | ICD-10-CM | POA: Diagnosis present

## 2022-02-21 DIAGNOSIS — Z87891 Personal history of nicotine dependence: Secondary | ICD-10-CM | POA: Diagnosis not present

## 2022-02-21 DIAGNOSIS — I824Y2 Acute embolism and thrombosis of unspecified deep veins of left proximal lower extremity: Principal | ICD-10-CM

## 2022-02-21 DIAGNOSIS — Y838 Other surgical procedures as the cause of abnormal reaction of the patient, or of later complication, without mention of misadventure at the time of the procedure: Secondary | ICD-10-CM | POA: Diagnosis present

## 2022-02-21 DIAGNOSIS — R7303 Prediabetes: Secondary | ICD-10-CM | POA: Diagnosis present

## 2022-02-21 DIAGNOSIS — I82412 Acute embolism and thrombosis of left femoral vein: Secondary | ICD-10-CM | POA: Diagnosis present

## 2022-02-21 DIAGNOSIS — I890 Lymphedema, not elsewhere classified: Secondary | ICD-10-CM | POA: Diagnosis present

## 2022-02-21 DIAGNOSIS — E860 Dehydration: Secondary | ICD-10-CM | POA: Diagnosis present

## 2022-02-21 DIAGNOSIS — I82432 Acute embolism and thrombosis of left popliteal vein: Secondary | ICD-10-CM | POA: Diagnosis present

## 2022-02-21 DIAGNOSIS — Z6838 Body mass index (BMI) 38.0-38.9, adult: Secondary | ICD-10-CM

## 2022-02-21 DIAGNOSIS — I2609 Other pulmonary embolism with acute cor pulmonale: Secondary | ICD-10-CM | POA: Diagnosis not present

## 2022-02-21 LAB — CBC WITH DIFFERENTIAL/PLATELET
Abs Immature Granulocytes: 0.04 10*3/uL (ref 0.00–0.07)
Basophils Absolute: 0 10*3/uL (ref 0.0–0.1)
Basophils Relative: 0 %
Eosinophils Absolute: 0.1 10*3/uL (ref 0.0–0.5)
Eosinophils Relative: 1 %
HCT: 39.4 % (ref 36.0–46.0)
Hemoglobin: 13.2 g/dL (ref 12.0–15.0)
Immature Granulocytes: 1 %
Lymphocytes Relative: 15 %
Lymphs Abs: 1.2 10*3/uL (ref 0.7–4.0)
MCH: 29.2 pg (ref 26.0–34.0)
MCHC: 33.5 g/dL (ref 30.0–36.0)
MCV: 87.2 fL (ref 80.0–100.0)
Monocytes Absolute: 0.4 10*3/uL (ref 0.1–1.0)
Monocytes Relative: 5 %
Neutro Abs: 5.8 10*3/uL (ref 1.7–7.7)
Neutrophils Relative %: 78 %
Platelets: 169 10*3/uL (ref 150–400)
RBC: 4.52 MIL/uL (ref 3.87–5.11)
RDW: 12.4 % (ref 11.5–15.5)
WBC: 7.5 10*3/uL (ref 4.0–10.5)
nRBC: 0 % (ref 0.0–0.2)

## 2022-02-21 LAB — BASIC METABOLIC PANEL
Anion gap: 7 (ref 5–15)
BUN: 12 mg/dL (ref 6–20)
CO2: 23 mmol/L (ref 22–32)
Calcium: 8.8 mg/dL — ABNORMAL LOW (ref 8.9–10.3)
Chloride: 107 mmol/L (ref 98–111)
Creatinine, Ser: 0.85 mg/dL (ref 0.44–1.00)
GFR, Estimated: >60 mL/min (ref >60)
Glucose, Bld: 112 mg/dL — ABNORMAL HIGH (ref 70–99)
Potassium: 3.9 mmol/L (ref 3.5–5.1)
Sodium: 137 mmol/L (ref 135–145)

## 2022-02-21 LAB — PREGNANCY, URINE: Preg Test, Ur: NEGATIVE

## 2022-02-21 MED ORDER — SENNOSIDES-DOCUSATE SODIUM 8.6-50 MG PO TABS: 1.0000 | ORAL_TABLET | Freq: Every evening | ORAL | Status: DC | PRN

## 2022-02-21 MED ORDER — OXYCODONE HCL 5 MG PO TABS: 5.0000 mg | ORAL_TABLET | ORAL | Status: DC | PRN

## 2022-02-21 MED ORDER — MORPHINE SULFATE (PF) 4 MG/ML IV SOLN
4.0000 mg | Freq: Once | INTRAVENOUS | Status: AC
  Administered 2022-02-21: 4 mg via INTRAVENOUS
  Filled 2022-02-21: qty 1

## 2022-02-21 MED ORDER — ONDANSETRON HCL 4 MG/2ML IJ SOLN
4.0000 mg | Freq: Once | INTRAMUSCULAR | Status: AC
  Administered 2022-02-21: 4 mg via INTRAVENOUS
  Filled 2022-02-21: qty 2

## 2022-02-21 MED ORDER — ACETAMINOPHEN 650 MG RE SUPP: 650.0000 mg | Freq: Four times a day (QID) | RECTAL | Status: DC | PRN

## 2022-02-21 MED ORDER — SODIUM CHLORIDE 0.9 % IV SOLN: INTRAVENOUS | Status: AC

## 2022-02-21 MED ORDER — ONDANSETRON HCL 4 MG PO TABS: 4.0000 mg | ORAL_TABLET | Freq: Four times a day (QID) | ORAL | Status: DC | PRN

## 2022-02-21 MED ORDER — ONDANSETRON HCL 4 MG/2ML IJ SOLN: 4.0000 mg | Freq: Four times a day (QID) | INTRAMUSCULAR | Status: DC | PRN

## 2022-02-21 MED ORDER — HYDROMORPHONE HCL 1 MG/ML IJ SOLN: 0.5000 mg | INTRAMUSCULAR | Status: DC | PRN

## 2022-02-21 MED ORDER — HYDROMORPHONE HCL 1 MG/ML IJ SOLN
1.0000 mg | Freq: Once | INTRAMUSCULAR | Status: AC
  Administered 2022-02-21: 1 mg via INTRAVENOUS
  Filled 2022-02-21: qty 1

## 2022-02-21 MED ORDER — HEPARIN BOLUS VIA INFUSION
5000.0000 [IU] | Freq: Once | INTRAVENOUS | Status: AC
  Administered 2022-02-21: 5000 [IU] via INTRAVENOUS

## 2022-02-21 MED ORDER — IOHEXOL 350 MG/ML SOLN
150.0000 mL | Freq: Once | INTRAVENOUS | Status: AC | PRN
  Administered 2022-02-21: 150 mL via INTRAVENOUS

## 2022-02-21 MED ORDER — HEPARIN (PORCINE) 25000 UT/250ML-% IV SOLN
1500.0000 [IU]/h | INTRAVENOUS | Status: DC
Start: 2022-02-21 — End: 2022-02-22
  Administered 2022-02-21: 1500 [IU]/h via INTRAVENOUS
  Filled 2022-02-21: qty 250

## 2022-02-21 MED ORDER — SODIUM CHLORIDE 0.9% FLUSH
3.0000 mL | Freq: Two times a day (BID) | INTRAVENOUS | Status: DC
  Administered 2022-02-22 – 2022-02-23 (×4): 3 mL via INTRAVENOUS

## 2022-02-21 MED ORDER — ACETAMINOPHEN 325 MG PO TABS
650.0000 mg | ORAL_TABLET | Freq: Four times a day (QID) | ORAL | Status: DC | PRN
  Administered 2022-02-21 – 2022-02-22 (×3): 650 mg via ORAL
  Filled 2022-02-21 (×3): qty 2

## 2022-02-21 MED ORDER — SODIUM CHLORIDE 0.9 % IV BOLUS
1000.0000 mL | Freq: Once | INTRAVENOUS | Status: AC
  Administered 2022-02-21: 1000 mL via INTRAVENOUS

## 2022-02-21 NOTE — ED Notes (Signed)
Pt requested a healthy choice meatloaf meal and ginger ale to drink. Pt meal was cooking when care link came to pickup. Pt did not get anything to eat.

## 2022-02-21 NOTE — ED Notes (Addendum)
PT IS AWARE THAT A URINE SAMPLE IS NEEDED, STATES SHE STILL CANNOT VOID

## 2022-02-21 NOTE — Consult Note (Signed)
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  Given the extensive swelling and the extent of the clot I think she would benefit from mechanical thrombectomy.  The main purpose of this is to lower her risk of postphlebitic syndrome.  She should be fully anticoagulated and I will try to get her onto the schedule for today.  I would continue gentle hydration.  I have written very specific instructions on how to elevate the leg effectively.  I I have discussed the indications for the procedure with the patient and the potential complications, including but not limited to bleeding and pulmonary embolus.    REASON FOR CONSULT:    DVT left lower extremity.  The consult is requested by [facility] ED.   HPI:   Jody Henson is a 31 y.o. female who had sustained an ankle fracture and underwent surgery on the left ankle on 01/10/22.  Since that surgery she has been fairly immobile.  The patient presented to the emergency department with pain and swelling in the left lower extremity which began around 2 AM today.  In the emergency department in [facility] she denied any chest pain or shortness of breath.  On my history, the patient broke her left ankle and underwent surgery on the 15th.  She developed swelling yesterday morning at 2 AM.  She developed pain in the thigh.  She denies chest pain or shortness of breath.  She has no previous history of DVT or significant leg swelling.  Her her risk factors for DVT include immobility, recent surgery, and obesity.  She has no history of significant bleeding problems.  Past Medical History:  Diagnosis Date   Hypertension affecting pregnancy     No family history on file.  SOCIAL HISTORY: Social History   Tobacco Use   Smoking status: Former    Types: Cigars   Smokeless tobacco: Never  Substance Use Topics    Alcohol use: Yes    No Known Allergies  Current Facility-Administered Medications  Medication Dose Route Frequency Provider Last Rate Last Admin   heparin ADULT infusion 100 units/mL (25000 units/210mL)  1,500 Units/hr Intravenous Continuous Rumbarger, Faye Ramsay, RPH 15 mL/hr at 02/21/22 1741 1,500 Units/hr at 02/21/22 1741   Current Outpatient Medications  Medication Sig Dispense Refill   gentamicin (GARAMYCIN) 0.3 % ophthalmic solution Place 1 drop into the right eye every 4 (four) hours. 5 mL 0   ketotifen (ZADITOR) 0.025 % ophthalmic solution Place 1 drop into the right eye 2 (two) times daily. 5 mL 0   metroNIDAZOLE (FLAGYL) 500 MG tablet Take 1 tablet (500 mg total) by mouth 2 (two) times daily. One po bid x 7 days 14 tablet 0    REVIEW OF SYSTEMS:  [X]  denotes positive finding, [ ]  denotes negative finding Cardiac  Comments:  Chest pain or chest pressure:    Shortness of breath upon exertion:    Short of breath when lying flat:    Irregular heart rhythm:        Vascular    Pain in calf, thigh, or hip brought on by ambulation:    Pain in feet at night that wakes you up from your sleep:     Blood clot in your veins:    Leg swelling:  x       Pulmonary    Oxygen at home:  Productive cough:     Wheezing:         Neurologic    Sudden weakness in arms or legs:     Sudden numbness in arms or legs:     Sudden onset of difficulty speaking or slurred speech:    Temporary loss of vision in one eye:     Problems with dizziness:         Gastrointestinal    Blood in stool:     Vomited blood:         Genitourinary    Burning when urinating:     Blood in urine:        Psychiatric    Major depression:         Hematologic    Bleeding problems:    Problems with blood clotting too easily:        Skin    Rashes or ulcers:        Constitutional    Fever or chills:    -  PHYSICAL EXAM:   Vitals:   02/21/22 1630 02/21/22 1730 02/21/22 1800 02/21/22 1827  BP:  114/78 111/79 114/77   Pulse: 72 79 75   Resp: 16 17 16    Temp:    97.7 F (36.5 C)  TempSrc:    Oral  SpO2: 100% 100% 100%   Weight:      Height:       Body mass index is 38.9 kg/m.  GENERAL: The patient is a well-nourished female, in no acute distress. The vital signs are documented above. CARDIAC: There is a regular rate and rhythm.  VASCULAR: I do not detect carotid bruits. She has a palpable right dorsalis pedis and posterior tibial pulse. She has a biphasic dorsalis pedis and posterior tibial signal on the left. She has significant left lower extremity swelling. PULMONARY: There is good air exchange bilaterally without wheezing or rales. ABDOMEN: Soft and non-tender with normal pitched bowel sounds.  MUSCULOSKELETAL: There are no major deformities. NEUROLOGIC: No focal weakness or paresthesias are detected. SKIN: There are no ulcers or rashes noted. PSYCHIATRIC: The patient has a normal affect.  DATA:    CT CHEST: CT of the chest shows acute bilateral pulmonary emboli without evidence of right heart strain.  The clot burden is most extensive in the lower lobes.  CT VENOGRAM ABDOMEN PELVIS: The CT venogram of the abdomen and pelvis shows extensive thrombus which begins in the proximal left common iliac vein and extends down through the external iliac vein into the common femoral vein and saphenofemoral junction  VENOUS DUPLEX: I have reviewed the venous duplex scan that was done today of the left lower extremity.  This shows acute occlusive thrombus involving the common femoral vein, the saphenofemoral junction and the great saphenous vein in the proximal thigh, deep femoral vein, femoral vein, popliteal vein, and posterior tibial and peroneal veins.  LABS: Her creatinine is 0.85.  GFR is greater than 60.  Hemoglobin 13.2.  Platelets 169,000.  Urine pregnancy test is negative.  Deitra Mayo [facility]

## 2022-02-21 NOTE — ED Triage Notes (Signed)
Pt reports swelling to LLE and pain to LT thigh since this morning; ankle fx repair to same 01/10/22

## 2022-02-21 NOTE — H&P (Signed)
History and Physical    Jody Henson JSE:[id number] DOB: 21-Nov-1990 DOA: 02/21/2022  PCP: Michaelle Birks [facility]   Patient coming from: Home   Chief Complaint: Left leg swelling   HPI: Jody Henson is a pleasant 31 y.o. female who reports a history of prediabetes and lymphedema and now presents to the emergency department for evaluation of left leg swelling.  Patient underwent ORIF of the left ankle on 01/10/2022, reports uncomplicated postoperative period, and had been in her usual state until early this morning when she noticed swelling throughout the left leg.  She denies any chest pain, cough, shortness of breath, or lightheadedness.  She has diminished sensation in the left foot ever since her surgery last month.  [facility] ED Course: Upon arrival to the ED, patient is found to be afebrile and saturating well on room air with stable blood pressure.  BMP and CBC are unremarkable.  Venous ultrasound demonstrates diffuse LLE DVT.  CT venogram demonstrates extensive iliofemoral DVT extending from the left common iliac vein and appears occlusive from the external iliac vein.  CTA chest is concerning for acute bilateral PE without evidence for right heart strain.  Patient was started on IV heparin and treated with analgesics and IV fluid.  Vascular surgery was consulted by the ED physician and the patient was transferred to [facility] for admission.  Review of Systems:  All other systems reviewed and apart from HPI, are negative.  Past Medical History:  Diagnosis Date   Hypertension affecting pregnancy     Past Surgical History:  Procedure Laterality Date   ANKLE FRACTURE SURGERY Left    DENTAL SURGERY     DILATION AND CURETTAGE OF UTERUS      Social History:   reports that she has quit smoking. Her smoking use included cigars. She has never used smokeless tobacco. She reports current alcohol use. She reports that she does not use drugs.  No Known Allergies  History  reviewed. No pertinent family history.   Prior to Admission medications   Medication Sig Start Date End Date Taking? Authorizing Provider  gentamicin (GARAMYCIN) 0.3 % ophthalmic solution Place 1 drop into the right eye every 4 (four) hours. 10/15/16   Arthor Captain, PA-C  ketotifen (ZADITOR) 0.025 % ophthalmic solution Place 1 drop into the right eye 2 (two) times daily. 10/15/16   Arthor Captain, PA-C  metroNIDAZOLE (FLAGYL) 500 MG tablet Take 1 tablet (500 mg total) by mouth 2 (two) times daily. One po bid x 7 days 11/01/15   Nicanor Alcon, April, MD    Physical Exam: Vitals:   02/21/22 1730 02/21/22 1800 02/21/22 1827 02/21/22 2240  BP: 111/79 114/77  116/74  Pulse: 79 75  88  Resp: 17 16  20   Temp:   97.7 F (36.5 C) 97.8 F (36.6 C)  TempSrc:   Oral Oral  SpO2: 100% 100%    Weight:      Height:        Constitutional: NAD, calm  Eyes: PERTLA, lids and conjunctivae normal ENMT: Mucous membranes are moist. Posterior pharynx clear of any exudate or lesions.   Neck: supple, no masses  Respiratory: no wheezing, no crackles. No accessory muscle use.  Cardiovascular: S1 & S2 heard, regular rate and rhythm. B/l LE swelling Lt >> Rt.   Abdomen: No distension, no tenderness, soft. Bowel sounds active.  Musculoskeletal: no clubbing / cyanosis. No joint deformity upper and lower extremities.   Skin: no significant rashes, lesions, ulcers. Warm, dry, well-perfused. Neurologic:  CN 2-12 grossly intact. Moving all extremities. Alert and oriented.  Psychiatric: Pleasant. Cooperative.    Labs and Imaging on Admission: I have personally reviewed following labs and imaging studies  CBC: Recent Labs  Lab 02/21/22 1349  WBC 7.5  NEUTROABS 5.8  HGB 13.2  HCT 39.4  MCV 87.2  PLT 242   Basic Metabolic Panel: Recent Labs  Lab 02/21/22 1349  NA 137  K 3.9  CL 107  CO2 23  GLUCOSE 112*  BUN 12  CREATININE 0.85  CALCIUM 8.8*   GFR: Estimated Creatinine Clearance: 121.2 mL/min (by  C-G formula based on SCr of 0.85 mg/dL). Liver Function Tests: No results for input(s): "AST", "ALT", "ALKPHOS", "BILITOT", "PROT", "ALBUMIN" in the last 168 hours. No results for input(s): "LIPASE", "AMYLASE" in the last 168 hours. No results for input(s): "AMMONIA" in the last 168 hours. Coagulation Profile: No results for input(s): "INR", "PROTIME" in the last 168 hours. Cardiac Enzymes: No results for input(s): "CKTOTAL", "CKMB", "CKMBINDEX", "TROPONINI" in the last 168 hours. BNP (last 3 results) No results for input(s): "PROBNP" in the last 8760 hours. HbA1C: No results for input(s): "HGBA1C" in the last 72 hours. CBG: No results for input(s): "GLUCAP" in the last 168 hours. Lipid Profile: No results for input(s): "CHOL", "HDL", "LDLCALC", "TRIG", "CHOLHDL", "LDLDIRECT" in the last 72 hours. Thyroid Function Tests: No results for input(s): "TSH", "T4TOTAL", "FREET4", "T3FREE", "THYROIDAB" in the last 72 hours. Anemia Panel: No results for input(s): "VITAMINB12", "FOLATE", "FERRITIN", "TIBC", "IRON", "RETICCTPCT" in the last 72 hours. Urine analysis:    Component Value Date/Time   COLORURINE YELLOW 03/02/2018 1510   APPEARANCEUR CLEAR 03/02/2018 1510   LABSPEC >1.030 (H) 03/02/2018 1510   PHURINE 6.0 03/02/2018 1510   GLUCOSEU NEGATIVE 03/02/2018 1510   HGBUR NEGATIVE 03/02/2018 1510   BILIRUBINUR NEGATIVE 03/02/2018 1510   KETONESUR 15 (A) 03/02/2018 1510   PROTEINUR NEGATIVE 03/02/2018 1510   UROBILINOGEN 1.0 10/12/2014 2305   NITRITE NEGATIVE 03/02/2018 1510   LEUKOCYTESUR TRACE (A) 03/02/2018 1510   Sepsis Labs: @LABRCNTIP (procalcitonin:4,lacticidven:4) )No results found for this or any previous visit (from the past 240 hour(s)).   Radiological Exams on Admission: CT Angio Chest PE W/Cm &/Or Wo Cm  Result Date: 02/21/2022 CLINICAL DATA:  Pulmonary embolism (PE) suspected, unknown D-dimer; Deep venous thrombosis (DVT) EXAM: CT ANGIOGRAPHY CHEST WITH CONTRAST CT  VENOGRAM ABDOMEN AND PELVIS AND LOWER EXTREMITY BILATERAL TECHNIQUE: Multidetector CT imaging of the chest, abdomen, and pelvis was performed using the standard protocol during bolus administration of intravenous contrast. Multiplanar CT image reconstructions and MIPs were obtained to evaluate the vascular anatomy. Delayed imaging of the abdomen and pelvis was obtained to evaluate the venous structures. RADIATION DOSE REDUCTION: This exam was performed according to the departmental dose-optimization program which includes automated exposure control, adjustment of the mA and/or kV according to patient size and/or use of iterative reconstruction technique. CONTRAST:  130mL OMNIPAQUE IOHEXOL 350 MG/ML SOLN COMPARISON:  Same day ultrasound, CT abdomen pelvis 06/29/2009. FINDINGS: CTA chest: Cardiovascular: Satisfactory opacification of the pulmonary arteries to the segmental level. There are lobar, segmental, and subsegmental pulmonary emboli in the lower lobes. There are subsegmental pulmonary emboli in the upper lobes. There is no CT evidence of right heart strain. RV: LV ratio is 0.9.no pericardial disease. The thoracic aorta is unremarkable. Mediastinum/Nodes: No lymphadenopathy. The thyroid is unremarkable. Small hiatal hernia. Lungs/Pleura: No focal airspace consolidation. No pleural effusion. No pneumothorax. No suspicious pulmonary nodules. Musculoskeletal: No chest wall abnormality. No acute  or significant osseous findings. Review of the MIP images confirms the above findings. CT abdomen pelvis: Hepatobiliary: No focal liver abnormality is seen. Cholelithiasis. No cholecystitis. Pancreas: Unremarkable. No pancreatic ductal dilatation or surrounding inflammatory changes. Spleen: Normal in size without focal abnormality. Adrenals/Urinary Tract: Adrenal glands are unremarkable. No hydronephrosis or nephrolithiasis. The bladder is minimally distended. Stomach/Bowel: Small hiatal hernia. There is no evidence of  bowel obstruction.Normal appendix. Vascular/Lymphatic: There is extensive venous thrombosis extending from the left iliac vein origin, through the common and external iliac vein, femoral vein, popliteal vein, extending into the calf beyond the field of view. There is also thrombosis at the saphenofemoral junction extending into the proximal great saphenous vein with some nonocclusive thrombus seen in the more distal great saphenous vein. There is adjacent soft tissue stranding. The abdominal aorta is unremarkable. Reproductive: There is a large uterine fibroid which measures up to 7.6 by 6.0 cm (series 10, image 72). Other: Trace free fluid in the pelvis.  No hernia.  No free air. Musculoskeletal: No acute osseous abnormality. No suspicious osseous lesion. There is superficial soft tissue swelling of the left lower extremity. IMPRESSION: Acute bilateral pulmonary emboli without CT evidence of right heart strain. Clot burden is most extensive in the lower lobes involving the lobar, segmental, and subsegmental branches. Upper lobe involvement is within the subsegmental branches. Extensive iliofemoral DVT, extending from the left common iliac vein origin the pelvis through the left iliac veins, left femoral vein, left popliteal vein, and into the calf at the edge of the field of view. Clot burden appears occlusive from the level of the external iliac vein distally, with adjacent soft tissue stranding consistent with thrombophlebitis. There is clot at the saphenofemoral junction extending into the great saphenous vein and with additional nonocclusive clot seen within the more distal great saphenous vein. Critical Value/emergent results were called by telephone at the time of interpretation on 02/21/2022 at 6:02 pm to provider Dr. Jacqulyn BathLong, who verbally acknowledged these results. Electronically Signed   By: Caprice RenshawJacob  Kahn M.D.   On: 02/21/2022 18:09   CT VENOGRAM ABD/PELVIS/LOWER EXT BILAT  Result Date: 02/21/2022 CLINICAL  DATA:  Pulmonary embolism (PE) suspected, unknown D-dimer; Deep venous thrombosis (DVT) EXAM: CT ANGIOGRAPHY CHEST WITH CONTRAST CT VENOGRAM ABDOMEN AND PELVIS AND LOWER EXTREMITY BILATERAL TECHNIQUE: Multidetector CT imaging of the chest, abdomen, and pelvis was performed using the standard protocol during bolus administration of intravenous contrast. Multiplanar CT image reconstructions and MIPs were obtained to evaluate the vascular anatomy. Delayed imaging of the abdomen and pelvis was obtained to evaluate the venous structures. RADIATION DOSE REDUCTION: This exam was performed according to the departmental dose-optimization program which includes automated exposure control, adjustment of the mA and/or kV according to patient size and/or use of iterative reconstruction technique. CONTRAST:  150mL OMNIPAQUE IOHEXOL 350 MG/ML SOLN COMPARISON:  Same day ultrasound, CT abdomen pelvis 06/29/2009. FINDINGS: CTA chest: Cardiovascular: Satisfactory opacification of the pulmonary arteries to the segmental level. There are lobar, segmental, and subsegmental pulmonary emboli in the lower lobes. There are subsegmental pulmonary emboli in the upper lobes. There is no CT evidence of right heart strain. RV: LV ratio is 0.9.no pericardial disease. The thoracic aorta is unremarkable. Mediastinum/Nodes: No lymphadenopathy. The thyroid is unremarkable. Small hiatal hernia. Lungs/Pleura: No focal airspace consolidation. No pleural effusion. No pneumothorax. No suspicious pulmonary nodules. Musculoskeletal: No chest wall abnormality. No acute or significant osseous findings. Review of the MIP images confirms the above findings. CT abdomen pelvis: Hepatobiliary: No focal  liver abnormality is seen. Cholelithiasis. No cholecystitis. Pancreas: Unremarkable. No pancreatic ductal dilatation or surrounding inflammatory changes. Spleen: Normal in size without focal abnormality. Adrenals/Urinary Tract: Adrenal glands are unremarkable. No  hydronephrosis or nephrolithiasis. The bladder is minimally distended. Stomach/Bowel: Small hiatal hernia. There is no evidence of bowel obstruction.Normal appendix. Vascular/Lymphatic: There is extensive venous thrombosis extending from the left iliac vein origin, through the common and external iliac vein, femoral vein, popliteal vein, extending into the calf beyond the field of view. There is also thrombosis at the saphenofemoral junction extending into the proximal great saphenous vein with some nonocclusive thrombus seen in the more distal great saphenous vein. There is adjacent soft tissue stranding. The abdominal aorta is unremarkable. Reproductive: There is a large uterine fibroid which measures up to 7.6 by 6.0 cm (series 10, image 72). Other: Trace free fluid in the pelvis.  No hernia.  No free air. Musculoskeletal: No acute osseous abnormality. No suspicious osseous lesion. There is superficial soft tissue swelling of the left lower extremity. IMPRESSION: Acute bilateral pulmonary emboli without CT evidence of right heart strain. Clot burden is most extensive in the lower lobes involving the lobar, segmental, and subsegmental branches. Upper lobe involvement is within the subsegmental branches. Extensive iliofemoral DVT, extending from the left common iliac vein origin the pelvis through the left iliac veins, left femoral vein, left popliteal vein, and into the calf at the edge of the field of view. Clot burden appears occlusive from the level of the external iliac vein distally, with adjacent soft tissue stranding consistent with thrombophlebitis. There is clot at the saphenofemoral junction extending into the great saphenous vein and with additional nonocclusive clot seen within the more distal great saphenous vein. Critical Value/emergent results were called by telephone at the time of interpretation on 02/21/2022 at 6:02 pm to provider Dr. Jacqulyn Bath, who verbally acknowledged these results. Electronically  Signed   By: Caprice Renshaw M.D.   On: 02/21/2022 18:09   US Venous Img Lower  Left (DVT Study)  Result Date: 02/21/2022 CLINICAL DATA:  Left ankle fracture Left thigh pain and swelling EXAM: LEFT LOWER EXTREMITY VENOUS DOPPLER ULTRASOUND TECHNIQUE: Gray-scale sonography with graded compression, as well as color Doppler and duplex ultrasound were performed to evaluate the lower extremity deep venous systems from the level of the common femoral vein and including the common femoral, femoral, profunda femoral, popliteal and calf veins including the posterior tibial, peroneal and gastrocnemius veins when visible. The superficial great saphenous vein was also interrogated. Spectral Doppler was utilized to evaluate flow at rest and with distal augmentation maneuvers in the common femoral, femoral and popliteal veins. COMPARISON:  10/13/2014 FINDINGS: Contralateral Common Femoral Vein: Respiratory phasicity is normal and symmetric with the symptomatic side. No evidence of thrombus. Normal compressibility. Common Femoral Vein: Acute occlusive thrombus. No flow. Noncompressible. Saphenofemoral Junction: Acute occlusive thrombus extending into the origin of the greater saphenous vein. No flow. Noncompressible. Profunda Femoral Vein: Acute occlusive thrombus. No flow. Noncompressible. Femoral Vein: Diffuse acute occlusive thrombus.  Noncompressible. Popliteal Vein: Acute occlusive thrombus.  Noncompressible. Calf Veins: Thrombosed common noncompressible posterior tibial and peroneal veins. Superficial Great Saphenous Vein: No thrombus seen within the greater saphenous vein at the level of the knee. The greater saphenous vein in the proximal thigh is thrombosed. Other Findings:  None. IMPRESSION: Diffuse acute deep venous thrombosis of left lower extremity. Electronically Signed   By: Acquanetta Belling M.D.   On: 02/21/2022 15:15     Assessment/Plan   1.  Acute DVT, PE  - Patient s/p left ankle ORIF on 01/10/22 p/w LLE  swelling and found to have extensive left iliofemoral DVT and bilateral PE without heart strain  - She was started on IV heparin in ED and vascular surgery was consulted  - Appreciate vascular surgery consult, she may benefit from mechanical thrombectomy - Keep NPO after midnight, continue IV heparin and leg elevation as directed by vascular surgery   - In terms of the PE, there is no heart strain, hypoxia, or hemodynamic instability     DVT prophylaxis: IV heparin  Code Status: Full  Level of Care: Level of care: Telemetry Medical Family Communication: none present  Disposition Plan:  Patient is from: home  Anticipated d/c is to: Home  Anticipated d/c date is: 02/24/22  Patient currently: Pending vascular surgery consultation, transition to oral anticoagulant  Consults called: Vascular surgery  Admission status: Inpatient     Briscoe Deutscher, MD [facility]  02/22/2022, 1:08 AM

## 2022-02-21 NOTE — ED Provider Notes (Signed)
[facility] EMERGENCY DEPARTMENT Provider Note   CSN: [id number] Arrival date & time: 02/21/22  1221     History  Chief Complaint  Patient presents with   Leg Swelling   HPI Jody Henson is a 31 y.o. female with recent left ankle surgery in August 2023 presenting for leg swelling.  Pain started this morning around 2 AM pain is located in the left leg.  Starts in the groin and radiates down to the feet.  Patient stated that entire leg is tender, swollen and more purple than the leg.  Stated that she had ankle surgery on 15 August and since then has not been able to walk.  Endorses that she has been more sedentary than she usually is.  Denies fever.  Denies shortness of breath and chest pain.  HPI     Home Medications Prior to Admission medications   Medication Sig Start Date End Date Taking? Authorizing Provider  gentamicin (GARAMYCIN) 0.3 % ophthalmic solution Place 1 drop into the right eye every 4 (four) hours. 10/15/16   Arthor CaptainHarris, Abigail, PA-C  ketotifen (ZADITOR) 0.025 % ophthalmic solution Place 1 drop into the right eye 2 (two) times daily. 10/15/16   Arthor CaptainHarris, Abigail, PA-C  metroNIDAZOLE (FLAGYL) 500 MG tablet Take 1 tablet (500 mg total) by mouth 2 (two) times daily. One po bid x 7 days 11/01/15   Nicanor AlconPalumbo, April, MD      Allergies    Patient has no known allergies.    Review of Systems   Review of Systems  Musculoskeletal:        Left leg pain and swelling    Physical Exam Updated Vital Signs BP 114/77   Pulse 75   Temp 97.7 F (36.5 C) (Oral)   Resp 16   Ht 5\' 6"  (1.676 m)   Wt 109.3 kg   LMP 01/31/2022   SpO2 100%   BMI 38.90 kg/m  Physical Exam Vitals and nursing note reviewed.  HENT:     Head: Normocephalic and atraumatic.     Mouth/Throat:     Mouth: Mucous membranes are moist.  Eyes:     General:        Right eye: No discharge.        Left eye: No discharge.     Conjunctiva/sclera: Conjunctivae normal.  Cardiovascular:     Rate and  Rhythm: Normal rate and regular rhythm.     Pulses:          Radial pulses are 2+ on the right side and 2+ on the left side.       Dorsalis pedis pulses are 1+ on the right side and 1+ on the left side.     Heart sounds: Normal heart sounds.  Pulmonary:     Effort: Pulmonary effort is normal.     Breath sounds: Normal breath sounds.  Abdominal:     General: Abdomen is flat.     Palpations: Abdomen is soft.  Musculoskeletal:     Right lower leg: Normal.     Left lower leg: Swelling present. Edema present.     Comments: Swelling begins in the groin and extends all the way to the toes  Skin:    General: Skin is warm and dry.  Neurological:     General: No focal deficit present.  Psychiatric:        Mood and Affect: Mood normal.     ED Results / Procedures / Treatments   Labs (all labs ordered  are listed, but only abnormal results are displayed) Labs Reviewed  BASIC METABOLIC PANEL - Abnormal; Notable for the following components:      Result Value   Glucose, Bld 112 (*)    Calcium 8.8 (*)    All other components within normal limits  CBC WITH DIFFERENTIAL/PLATELET  PREGNANCY, URINE  HEPARIN LEVEL (UNFRACTIONATED)  POC URINE PREG, ED    EKG None  Radiology CT Angio Chest PE W/Cm &/Or Wo Cm  Result Date: 02/21/2022 CLINICAL DATA:  Pulmonary embolism (PE) suspected, unknown D-dimer; Deep venous thrombosis (DVT) EXAM: CT ANGIOGRAPHY CHEST WITH CONTRAST CT VENOGRAM ABDOMEN AND PELVIS AND LOWER EXTREMITY BILATERAL TECHNIQUE: Multidetector CT imaging of the chest, abdomen, and pelvis was performed using the standard protocol during bolus administration of intravenous contrast. Multiplanar CT image reconstructions and MIPs were obtained to evaluate the vascular anatomy. Delayed imaging of the abdomen and pelvis was obtained to evaluate the venous structures. RADIATION DOSE REDUCTION: This exam was performed according to the departmental dose-optimization program which includes  automated exposure control, adjustment of the mA and/or kV according to patient size and/or use of iterative reconstruction technique. CONTRAST:  OMNIPAQUE IOHEXOL 350 MG/ML SOLN COMPARISON:  Same day ultrasound, CT abdomen pelvis 06/29/2009. FINDINGS: CTA chest: Cardiovascular: Satisfactory opacification of the pulmonary arteries to the segmental level. There are lobar, segmental, and subsegmental pulmonary emboli in the lower lobes. There are subsegmental pulmonary emboli in the upper lobes. There is no CT evidence of right heart strain. RV: LV ratio is 0.9.no pericardial disease. The thoracic aorta is unremarkable. Mediastinum/Nodes: No lymphadenopathy. The thyroid is unremarkable. Small hiatal hernia. Lungs/Pleura: No focal airspace consolidation. No pleural effusion. No pneumothorax. No suspicious pulmonary nodules. Musculoskeletal: No chest wall abnormality. No acute or significant osseous findings. Review of the MIP images confirms the above findings. CT abdomen pelvis: Hepatobiliary: No focal liver abnormality is seen. Cholelithiasis. No cholecystitis. Pancreas: Unremarkable. No pancreatic ductal dilatation or surrounding inflammatory changes. Spleen: Normal in size without focal abnormality. Adrenals/Urinary Tract: Adrenal glands are unremarkable. No hydronephrosis or nephrolithiasis. The bladder is minimally distended. Stomach/Bowel: Small hiatal hernia. There is no evidence of bowel obstruction.Normal appendix. Vascular/Lymphatic: There is extensive venous thrombosis extending from the left iliac vein origin, through the common and external iliac vein, femoral vein, popliteal vein, extending into the calf beyond the field of view. There is also thrombosis at the saphenofemoral junction extending into the proximal great saphenous vein with some nonocclusive thrombus seen in the more distal great saphenous vein. There is adjacent soft tissue stranding. The abdominal aorta is unremarkable.  Reproductive: There is a large uterine fibroid which measures up to 7.6 by 6.0 cm (series 10, image 72). Other: Trace free fluid in the pelvis.  No hernia.  No free air. Musculoskeletal: No acute osseous abnormality. No suspicious osseous lesion. There is superficial soft tissue swelling of the left lower extremity. IMPRESSION: Acute bilateral pulmonary emboli without CT evidence of right heart strain. Clot burden is most extensive in the lower lobes involving the lobar, segmental, and subsegmental branches. Upper lobe involvement is within the subsegmental branches. Extensive iliofemoral DVT, extending from the left common iliac vein origin the pelvis through the left iliac veins, left femoral vein, left popliteal vein, and into the calf at the edge of the field of view. Clot burden appears occlusive from the level of the external iliac vein distally, with adjacent soft tissue stranding consistent with thrombophlebitis. There is clot at the saphenofemoral junction extending into the great saphenous  vein and with additional nonocclusive clot seen within the more distal great saphenous vein. Critical Value/emergent results were called by telephone at the time of interpretation on 02/21/2022 at 6:02 pm to provider Dr. Laverta Baltimore, who verbally acknowledged these results. Electronically Signed   By: Maurine Simmering M.D.   On: 02/21/2022 18:09   CT VENOGRAM ABD/PELVIS/LOWER EXT BILAT  Result Date: 02/21/2022 CLINICAL DATA:  Pulmonary embolism (PE) suspected, unknown D-dimer; Deep venous thrombosis (DVT) EXAM: CT ANGIOGRAPHY CHEST WITH CONTRAST CT VENOGRAM ABDOMEN AND PELVIS AND LOWER EXTREMITY BILATERAL TECHNIQUE: Multidetector CT imaging of the chest, abdomen, and pelvis was performed using the standard protocol during bolus administration of intravenous contrast. Multiplanar CT image reconstructions and MIPs were obtained to evaluate the vascular anatomy. Delayed imaging of the abdomen and pelvis was obtained to evaluate the  venous structures. RADIATION DOSE REDUCTION: This exam was performed according to the departmental dose-optimization program which includes automated exposure control, adjustment of the mA and/or kV according to patient size and/or use of iterative reconstruction technique. CONTRAST:  171mL OMNIPAQUE IOHEXOL 350 MG/ML SOLN COMPARISON:  Same day ultrasound, CT abdomen pelvis 06/29/2009. FINDINGS: CTA chest: Cardiovascular: Satisfactory opacification of the pulmonary arteries to the segmental level. There are lobar, segmental, and subsegmental pulmonary emboli in the lower lobes. There are subsegmental pulmonary emboli in the upper lobes. There is no CT evidence of right heart strain. RV: LV ratio is 0.9.no pericardial disease. The thoracic aorta is unremarkable. Mediastinum/Nodes: No lymphadenopathy. The thyroid is unremarkable. Small hiatal hernia. Lungs/Pleura: No focal airspace consolidation. No pleural effusion. No pneumothorax. No suspicious pulmonary nodules. Musculoskeletal: No chest wall abnormality. No acute or significant osseous findings. Review of the MIP images confirms the above findings. CT abdomen pelvis: Hepatobiliary: No focal liver abnormality is seen. Cholelithiasis. No cholecystitis. Pancreas: Unremarkable. No pancreatic ductal dilatation or surrounding inflammatory changes. Spleen: Normal in size without focal abnormality. Adrenals/Urinary Tract: Adrenal glands are unremarkable. No hydronephrosis or nephrolithiasis. The bladder is minimally distended. Stomach/Bowel: Small hiatal hernia. There is no evidence of bowel obstruction.Normal appendix. Vascular/Lymphatic: There is extensive venous thrombosis extending from the left iliac vein origin, through the common and external iliac vein, femoral vein, popliteal vein, extending into the calf beyond the field of view. There is also thrombosis at the saphenofemoral junction extending into the proximal great saphenous vein with some nonocclusive  thrombus seen in the more distal great saphenous vein. There is adjacent soft tissue stranding. The abdominal aorta is unremarkable. Reproductive: There is a large uterine fibroid which measures up to 7.6 by 6.0 cm (series 10, image 72). Other: Trace free fluid in the pelvis.  No hernia.  No free air. Musculoskeletal: No acute osseous abnormality. No suspicious osseous lesion. There is superficial soft tissue swelling of the left lower extremity. IMPRESSION: Acute bilateral pulmonary emboli without CT evidence of right heart strain. Clot burden is most extensive in the lower lobes involving the lobar, segmental, and subsegmental branches. Upper lobe involvement is within the subsegmental branches. Extensive iliofemoral DVT, extending from the left common iliac vein origin the pelvis through the left iliac veins, left femoral vein, left popliteal vein, and into the calf at the edge of the field of view. Clot burden appears occlusive from the level of the external iliac vein distally, with adjacent soft tissue stranding consistent with thrombophlebitis. There is clot at the saphenofemoral junction extending into the great saphenous vein and with additional nonocclusive clot seen within the more distal great saphenous vein. Critical Value/emergent results were called  by telephone at the time of interpretation on 02/21/2022 at 6:02 pm to provider Dr. Jacqulyn Bath, who verbally acknowledged these results. Electronically Signed   By: Caprice Renshaw M.D.   On: 02/21/2022 18:09   US Venous Img Lower  Left (DVT Study)  Result Date: 02/21/2022 CLINICAL DATA:  Left ankle fracture Left thigh pain and swelling EXAM: LEFT LOWER EXTREMITY VENOUS DOPPLER ULTRASOUND TECHNIQUE: Gray-scale sonography with graded compression, as well as color Doppler and duplex ultrasound were performed to evaluate the lower extremity deep venous systems from the level of the common femoral vein and including the common femoral, femoral, profunda femoral,  popliteal and calf veins including the posterior tibial, peroneal and gastrocnemius veins when visible. The superficial great saphenous vein was also interrogated. Spectral Doppler was utilized to evaluate flow at rest and with distal augmentation maneuvers in the common femoral, femoral and popliteal veins. COMPARISON:  10/13/2014 FINDINGS: Contralateral Common Femoral Vein: Respiratory phasicity is normal and symmetric with the symptomatic side. No evidence of thrombus. Normal compressibility. Common Femoral Vein: Acute occlusive thrombus. No flow. Noncompressible. Saphenofemoral Junction: Acute occlusive thrombus extending into the origin of the greater saphenous vein. No flow. Noncompressible. Profunda Femoral Vein: Acute occlusive thrombus. No flow. Noncompressible. Femoral Vein: Diffuse acute occlusive thrombus.  Noncompressible. Popliteal Vein: Acute occlusive thrombus.  Noncompressible. Calf Veins: Thrombosed common noncompressible posterior tibial and peroneal veins. Superficial Great Saphenous Vein: No thrombus seen within the greater saphenous vein at the level of the knee. The greater saphenous vein in the proximal thigh is thrombosed. Other Findings:  None. IMPRESSION: Diffuse acute deep venous thrombosis of left lower extremity. Electronically Signed   By: Acquanetta Belling M.D.   On: 02/21/2022 15:15    Procedures Procedures    Medications Ordered in ED Medications  heparin ADULT infusion 100 units/mL (25000 units/215mL) (1,500 Units/hr Intravenous New Bag/Given 02/21/22 1741)  morphine (PF) 4 MG/ML injection 4 mg (4 mg Intravenous Given 02/21/22 1337)  HYDROmorphone (DILAUDID) injection 1 mg (1 mg Intravenous Given 02/21/22 1425)  sodium chloride 0.9 % bolus 1,000 mL (0 mLs Intravenous Stopped 02/21/22 1615)  ondansetron (ZOFRAN) injection 4 mg (4 mg Intravenous Given 02/21/22 1515)  heparin bolus via infusion 5,000 Units (5,000 Units Intravenous Bolus from Bag 02/21/22 1741)  iohexol (OMNIPAQUE)  350 MG/ML injection 150 mL (150 mLs Intravenous Contrast Given 02/21/22 1653)    ED Course/ Medical Decision Making/ A&P                           Medical Decision Making Amount and/or Complexity of Data Reviewed Labs: ordered. Radiology: ordered.  Risk Prescription drug management. Decision regarding hospitalization.   This patient presents to the ED for concern of leg pain and swelling, this involves a number of treatment options, and is a complaint that carries with it a high risk of complications and morbidity.  The differential diagnosis includes DVT, PE, trauma, and post op infection.    Co morbidities: Discussed in HPI   EMR reviewed including pt PMHx, past surgical history and past visits to ER.   See HPI for more details   Lab Tests:   I ordered and independently interpreted labs. Labs notable for hyperglycemia   Imaging Studies:  Abnormal findings. I personally reviewed all imaging studies. Imaging notable for Diffuse acute DVT of the left lower extremity   Cardiac Monitoring:  The patient was maintained on a cardiac monitor.  I personally viewed and interpreted the cardiac monitored  which showed an underlying rhythm of: NSR NA   Medicines ordered:  I ordered medication including hydromorphone and morphine for pain, Zofran for nausea, normal saline bolus for fluid resuscitation and heparin for DVT treatment. Reevaluation of the patient after these medicines showed that the patient improved patient stated that pain in the left leg was improved. I have reviewed the patients home medicines and have made adjustments as needed   Consults/Attending Physician   I requested consultation with Dr. Edilia Bo of vascular surgery,  and discussed lab and imaging findings as well as pertinent plan - he recommends: Admission to the hospital for ongoing management of her occlusive left lower DVT and bilateral Pes  -Also spoke with hospital team at [facility] who  accepted her for admission.   Reevaluation:  After the interventions noted above I re-evaluated patient and found that they have :improved   Problem List / ED Course: Patient presented for leg swelling in the setting of recent ankle surgery in that leg and 2 weeks of a sedentary lifestyle.  On exam, left leg was notably swollen, peripheral and painful. Immediate concern was DVT. Ultrasound confirmed DVT prompting further evaluation with CT angio of the lungs for concern for associated PE.  CT scan of the lungs did reveal PE in both lungs.  Ultrasound was unable to specify the proximal and distal ends of the DVT which prompted further evaluation with CT venogram.   Dispostion:  After consideration of the diagnostic results and the patients response to treatment, I feel that the patent would benefit from admission to the hospital for ongoing management for DVT and bilateral PEs.         Final Clinical Impression(s) / ED Diagnoses Final diagnoses:  Deep vein thrombosis (DVT) of proximal vein of left lower extremity, unspecified chronicity (HCC)  Acute pulmonary embolism, unspecified pulmonary embolism type, unspecified whether acute cor pulmonale present [facility])    Rx / DC Orders ED Discharge Orders     None         Gareth Eagle, PA-C 02/21/22 Carlis Stable    Benjiman Core, MD 02/24/22 1027

## 2022-02-21 NOTE — Progress Notes (Signed)
ANTICOAGULATION CONSULT NOTE - Initial Consult  Pharmacy Consult for heparin Indication: DVT  No Known Allergies  Patient Measurements: Height: 5\' 6"  (167.6 cm) Weight: 109.3 kg (241 lb) IBW/kg (Calculated) : 59.3 Heparin Dosing Weight: 85kg  Vital Signs: Temp: 98 F (36.7 C) (09/26 1432) Temp Source: Oral (09/26 1432) BP: 115/69 (09/26 1512) Pulse Rate: 71 (09/26 1515)  Labs: Recent Labs    02/21/22 1349  HGB 13.2  HCT 39.4  PLT 169  CREATININE 0.85    Estimated Creatinine Clearance: 121.2 mL/min (by C-G formula based on SCr of 0.85 mg/dL).   Medical History: Past Medical History:  Diagnosis Date   Hypertension affecting pregnancy     Medications:  Infusions:   heparin      Assessment: 64 yof presented to the ED with LLE pain. Found to have a DVT and now starting IV heparin. Baseline CBC is WNL and she is not on anticoagulation PTA.   Goal of Therapy:  Heparin level 0.3-0.7 units/ml Monitor platelets by anticoagulation protocol: Yes   Plan:  Heparin bolus 5000 units IV x 1 Heparin gtt 1500 units/hr Check a 6 hr heparin level Daily heparin level and CBC  Dewel Lotter, Rande Lawman 02/21/2022,3:33 PM

## 2022-02-21 NOTE — ED Notes (Addendum)
First contact with Patient. Patient requesting to eat. MD made aware - Patient informed MD that she was nauseated. Patient states "oh, yeah, I'm nauseated too" Patient medicated per order.   MD at bedside to discuss results and plan of care.   Patient given warm blanket and repositioned in bed for comfort. Patient placed on cardiac monitor. Will continue to monitor .  1615: Patient up to restroom with sister. Patient refused shoes or non-slip socks.   1625: Called CT - states they are on their way to get patient when preg results - holding heparin until patient has returned from CT. Provider made aware.   1920: Patient currently eating at this time - ok to eat till 0000. Patient states she is finishing up eating and it was explained the position she will be laying in as ordered by the Vascular surgeon.Patient will hit call light when she is finished eating.   1938: Patient laid down flat with knees slightly bent and ankles above knees at this time. Patient instructed the importance of maintaining position. Circulation intact on BLE. Patient denies shortness of breath and is speaking in full sentences. Patient remains on cardiac monitor.   2113: Report called to [facility][phone] to Amy RN. No questions voiced upon this RN's completion of report. Patient stable at this time. Eating dinner at this time.  2128: [facility] at bedside. Report given. Patient transported at this time.

## 2022-02-21 NOTE — Progress Notes (Signed)
Plan of Care Note for accepted transfer   Patient: Jody Henson MRN: [id number]   [place]: 02/21/2022  Facility requesting transfer: [facility] Requesting Provider: Candice Camp Reason for transfer: DVT, PE Facility course: 31 year old female with obesity and recent left ankle surgery who presented with significant LLE edema up to anterior thigh with discoloration.  Left lower extremity ultrasound shows diffuse acute DVT of the left lower extremity.  CT venogram shows extensive DVT up to iliofemoral region. CTA chest also reveals acute bilateral PE without heart strain.  ED PA reports +1 DP pulses bilaterally with good capillary refill of LE.    Has been started on IV heparin infusion.  Vascular surgery Dr. Scot Dock was consulted and will see in consultation.   Plan of care: The patient is accepted for admission to Telemetry unit, at [facility]..  ED PA to continue care of patient while she remains in the ED.  Author: Orene Desanctis, DO 02/21/2022  Check [email or web address] for on-call coverage.  Nursing staff, Please call [place] number on Amion as soon as patient's arrival, so appropriate admitting provider can evaluate the pt.

## 2022-02-22 ENCOUNTER — Encounter (HOSPITAL_COMMUNITY)
Admission: EM | Disposition: A | Discharge status: Home / Self Care | Payer: Self-pay | Source: Home / Self Care | Attending: Internal Medicine

## 2022-02-22 ENCOUNTER — Inpatient Hospital Stay (HOSPITAL_COMMUNITY): Payer: Medicaid Other

## 2022-02-22 ENCOUNTER — Other Ambulatory Visit (HOSPITAL_COMMUNITY): Payer: Self-pay

## 2022-02-22 ENCOUNTER — Telehealth (HOSPITAL_COMMUNITY): Payer: Self-pay

## 2022-02-22 ENCOUNTER — Other Ambulatory Visit (HOSPITAL_COMMUNITY): Payer: Medicaid Other

## 2022-02-22 DIAGNOSIS — I82422 Acute embolism and thrombosis of left iliac vein: Secondary | ICD-10-CM | POA: Diagnosis not present

## 2022-02-22 DIAGNOSIS — I2609 Other pulmonary embolism with acute cor pulmonale: Secondary | ICD-10-CM

## 2022-02-22 HISTORY — PX: PERIPHERAL VASCULAR THROMBECTOMY: CATH118306

## 2022-02-22 HISTORY — PX: LOWER EXTREMITY VENOGRAPHY: CATH118253

## 2022-02-22 HISTORY — PX: IVC VENOGRAPHY: CATH118301

## 2022-02-22 LAB — BASIC METABOLIC PANEL
Anion gap: 9 (ref 5–15)
BUN: 7 mg/dL (ref 6–20)
CO2: 22 mmol/L (ref 22–32)
Calcium: 8.7 mg/dL — ABNORMAL LOW (ref 8.9–10.3)
Chloride: 107 mmol/L (ref 98–111)
Creatinine, Ser: 0.66 mg/dL (ref 0.44–1.00)
GFR, Estimated: >60 mL/min (ref >60)
Glucose, Bld: 107 mg/dL — ABNORMAL HIGH (ref 70–99)
Potassium: 3.8 mmol/L (ref 3.5–5.1)
Sodium: 138 mmol/L (ref 135–145)

## 2022-02-22 LAB — HEPARIN LEVEL (UNFRACTIONATED)
Heparin Unfractionated: >1.1 IU/mL — ABNORMAL HIGH (ref 0.30–0.70)
Heparin Unfractionated: 0.68 IU/mL (ref 0.30–0.70)
Heparin Unfractionated: 0.9 IU/mL — ABNORMAL HIGH (ref 0.30–0.70)

## 2022-02-22 LAB — CBC
HCT: 35.7 % — ABNORMAL LOW (ref 36.0–46.0)
Hemoglobin: 11.9 g/dL — ABNORMAL LOW (ref 12.0–15.0)
MCH: 29.2 pg (ref 26.0–34.0)
MCHC: 33.3 g/dL (ref 30.0–36.0)
MCV: 87.5 fL (ref 80.0–100.0)
Platelets: 145 10*3/uL — ABNORMAL LOW (ref 150–400)
RBC: 4.08 MIL/uL (ref 3.87–5.11)
RDW: 12.4 % (ref 11.5–15.5)
WBC: 7.3 10*3/uL (ref 4.0–10.5)
nRBC: 0 % (ref 0.0–0.2)

## 2022-02-22 LAB — ECHOCARDIOGRAM COMPLETE
Area-P 1/2: 5.42 cm2
Calc EF: 57.4 %
Height: 66 in
S' Lateral: 2.5 cm
Single Plane A2C EF: 54.1 %
Single Plane A4C EF: 57.3 %
Weight: 3856 oz

## 2022-02-22 LAB — HIV ANTIBODY (ROUTINE TESTING W REFLEX): HIV Screen 4th Generation wRfx: NONREACTIVE

## 2022-02-22 SURGERY — PERIPHERAL VASCULAR THROMBECTOMY
Anesthesia: LOCAL

## 2022-02-22 MED ORDER — HEPARIN (PORCINE) 25000 UT/250ML-% IV SOLN
1100.0000 [IU]/h | INTRAVENOUS | Status: DC
  Filled 2022-02-22: qty 250

## 2022-02-22 MED ORDER — HEPARIN SODIUM (PORCINE) 1000 UNIT/ML IJ SOLN
INTRAMUSCULAR | Status: AC
  Filled 2022-02-22: qty 10

## 2022-02-22 MED ORDER — LIDOCAINE HCL (PF) 1 % IJ SOLN
INTRAMUSCULAR | Status: DC | PRN
  Administered 2022-02-22: 10 mL

## 2022-02-22 MED ORDER — HEPARIN SODIUM (PORCINE) 1000 UNIT/ML IJ SOLN
INTRAMUSCULAR | Status: DC | PRN
  Administered 2022-02-22: 10000 [IU] via INTRAVENOUS

## 2022-02-22 MED ORDER — FENTANYL CITRATE (PF) 100 MCG/2ML IJ SOLN
INTRAMUSCULAR | Status: AC
  Filled 2022-02-22: qty 2

## 2022-02-22 MED ORDER — IODIXANOL 320 MG/ML IV SOLN
INTRAVENOUS | Status: DC | PRN
  Administered 2022-02-22: 105 mL via INTRAVENOUS

## 2022-02-22 MED ORDER — PERFLUTREN LIPID MICROSPHERE
1.0000 mL | INTRAVENOUS | Status: AC | PRN
  Administered 2022-02-22: 1.5 mL via INTRAVENOUS

## 2022-02-22 MED ORDER — MIDAZOLAM HCL 2 MG/2ML IJ SOLN
INTRAMUSCULAR | Status: DC | PRN
  Administered 2022-02-22: 1 mg via INTRAVENOUS

## 2022-02-22 MED ORDER — METOPROLOL TARTRATE 12.5 MG HALF TABLET
12.5000 mg | ORAL_TABLET | Freq: Two times a day (BID) | ORAL | Status: DC
  Administered 2022-02-22 – 2022-02-23 (×2): 12.5 mg via ORAL
  Filled 2022-02-22 (×2): qty 1

## 2022-02-22 MED ORDER — MIDAZOLAM HCL 2 MG/2ML IJ SOLN
INTRAMUSCULAR | Status: AC
  Filled 2022-02-22: qty 2

## 2022-02-22 MED ORDER — HEPARIN (PORCINE) IN NACL 1000-0.9 UT/500ML-% IV SOLN
INTRAVENOUS | Status: AC
  Filled 2022-02-22: qty 500

## 2022-02-22 MED ORDER — HEPARIN (PORCINE) IN NACL 1000-0.9 UT/500ML-% IV SOLN
INTRAVENOUS | Status: DC | PRN
  Administered 2022-02-22 (×2): 500 mL

## 2022-02-22 MED ORDER — LIDOCAINE HCL (PF) 1 % IJ SOLN
INTRAMUSCULAR | Status: AC
  Filled 2022-02-22: qty 30

## 2022-02-22 MED ORDER — MELATONIN 3 MG PO TABS
3.0000 mg | ORAL_TABLET | Freq: Every evening | ORAL | Status: DC | PRN
  Administered 2022-02-22: 3 mg via ORAL
  Filled 2022-02-22: qty 1

## 2022-02-22 MED ORDER — FENTANYL CITRATE (PF) 100 MCG/2ML IJ SOLN
INTRAMUSCULAR | Status: DC | PRN
  Administered 2022-02-22 (×2): 50 ug via INTRAVENOUS

## 2022-02-22 MED ORDER — METOPROLOL TARTRATE 12.5 MG HALF TABLET: 12.5000 mg | ORAL_TABLET | Freq: Two times a day (BID) | ORAL | Status: DC

## 2022-02-22 SURGICAL SUPPLY — 16 items
BALLN MUSTANG 12X80X75 (BALLOONS) ×3
BALLOON MUSTANG 12X80X75 (BALLOONS) IMPLANT
CATH ANGIO 5F BER2 100CM (CATHETERS) IMPLANT
CATH CLOT TRIEVER BOLD (CATHETERS) IMPLANT
CATH HEADHUNTER 5F 125CM (CATHETERS) IMPLANT
CATH QUICKCROSS .035X135CM (MICROCATHETER) IMPLANT
CATH VISIONS PV .035 IVUS (CATHETERS) IMPLANT
GLIDEWIRE ADV .035X260CM (WIRE) IMPLANT
KIT ENCORE 26 ADVANTAGE (KITS) IMPLANT
KIT MICROPUNCTURE NIT STIFF (SHEATH) IMPLANT
PROTECTION STATION PRESSURIZED (MISCELLANEOUS) ×3
SHEATH CLOT RETRIEVER (SHEATH) IMPLANT
SHEATH PINNACLE 8F 10CM (SHEATH) IMPLANT
SHEATH PROBE COVER 6X72 (BAG) IMPLANT
STATION PROTECTION PRESSURIZED (MISCELLANEOUS) IMPLANT
WIRE AMPLATZ SS-J .035X260CM (WIRE) IMPLANT

## 2022-02-22 NOTE — Progress Notes (Addendum)
ANTICOAGULATION CONSULT NOTE- follow-up  Pharmacy Consult for heparin Indication: Acute PE and DVT  No Known Allergies  Patient Measurements: Height: 5\' 6"  (167.6 cm) Weight: 109.3 kg (241 lb) IBW/kg (Calculated) : 59.3 Heparin Dosing Weight: 85kg  Vital Signs: Temp: 98.7 F (37.1 C) (09/27 2047) Temp Source: Oral (09/27 2047) BP: 111/85 (09/27 2047) Pulse Rate: 114 (09/27 2047)  Labs: Recent Labs    02/21/22 1349 02/21/22 2328 02/22/22 0319 02/22/22 2014  HGB 13.2  --  11.9*  --   HCT 39.4  --  35.7*  --   PLT 169  --  145*  --   HEPARINUNFRC  --  >1.10* 0.68 0.90*  CREATININE 0.85  --  0.66  --     Estimated Creatinine Clearance: 128.7 mL/min (by C-G formula based on SCr of 0.66 mg/dL).  Assessment: 34 yof presented to the ED with LLE pain, found to have a DVT and PE.  Pharmacy consulted to dose IV heparin.   9/27 AM Heparin level now within goal at 0.68 on 1200 units/hr. Will confirm that level at goal. No bleeding issues noted.   Patient now s/p mechanical thrombectomy  9/27 PM HL: 0.90- supratherapeutic running at 1300 units/hr (confirmed on [facility] and with RN checking rate on pump) No signs of bleeding No issues with the infusion Surgical dressing in place  Goal of Therapy:  Heparin level 0.3-0.7 units/ml Monitor platelets by anticoagulation protocol: Yes   Plan:  Reduce heparin at 1100 units/hr Re-check HL with AM labs (9/28) Monitor CBC and HL daily while on heparin  Seaira Byus BS, PharmD, BCPS Clinical Pharmacist 02/22/2022 8:51 PM  Contact: [phone] after 3 PM  "Be curious, not judgmental..." -Jamal Maes

## 2022-02-22 NOTE — Consult Note (Signed)
Patient seen and examined prior to left lower extremity venous mechanical thrombectomy for iliofemoral deep venous thrombosis.  Patient with known PEs, no right heart strain appreciated on CT. I had an in-depth discussion with [name] regarding mechanical thrombectomy including the risks and benefits of the procedure.  After discussing these, she elected to proceed.  Being that this was a provoked DVT-recent surgery with minimal ambulation, I do not plan on stenting the left iliac even if compression is present.  She is aware that should she have another event, she may require stenting at that time.  Victorino Sparrow MD  ASSESSMENT & PLAN   LEFT ILIOFEMORAL DVT: This patient has an extensive left iliofemoral DVT likely secondary to immobility after left ankle surgery for a left ankle fracture.  Given the extensive swelling and the extent of the clot I think she would benefit from mechanical thrombectomy.  The main purpose of this is to lower her risk of postphlebitic syndrome.  She should be fully anticoagulated and I will try to get her onto the schedule for today.  I would continue gentle hydration.  I have written very specific instructions on how to elevate the leg effectively.  I I have discussed the indications for the procedure with the patient and the potential complications, including but not limited to bleeding and pulmonary embolus.    REASON FOR CONSULT:    DVT left lower extremity.  The consult is requested by [facility] ED.   HPI:   Jody Henson is a 31 y.o. female who had sustained an ankle fracture and underwent surgery on the left ankle on 01/10/22.  Since that surgery she has been fairly immobile.  The patient presented to the emergency department with pain and swelling in the left lower extremity which began around 2 AM today.  In the emergency department in [facility] she denied any chest pain or shortness of breath.  On my history, the patient broke her left  ankle and underwent surgery on the 15th.  She developed swelling yesterday morning at 2 AM.  She developed pain in the thigh.  She denies chest pain or shortness of breath.  She has no previous history of DVT or significant leg swelling.  Her her risk factors for DVT include immobility, recent surgery, and obesity.  She has no history of significant bleeding problems.  Past Medical History:  Diagnosis Date   Hypertension affecting pregnancy     History reviewed. No pertinent family history.  SOCIAL HISTORY: Social History   Tobacco Use   Smoking status: Former    Types: Cigars   Smokeless tobacco: Never  Substance Use Topics   Alcohol use: Yes    No Known Allergies  Current Facility-Administered Medications  Medication Dose Route Frequency Provider Last Rate Last Admin   0.9 %  sodium chloride infusion   Intravenous Continuous Chuck Hint, MD 125 mL/hr at 02/22/22 0643 Rate Change at 02/22/22 0643   [MAR Hold] acetaminophen (TYLENOL) tablet 650 mg  650 mg Oral Q6H PRN Briscoe Deutscher, MD   650 mg at 02/21/22 2339   Or   [MAR Hold] acetaminophen (TYLENOL) suppository 650 mg  650 mg Rectal Q6H PRN Opyd, Lavone Neri, MD       Heparin (Porcine) in NaCl 1000-0.9 UT/500ML-% SOLN    PRN Victorino Sparrow, MD   500 mL at 02/22/22 1118   heparin ADULT infusion 100 units/mL (25000 units/257mL)  1,300 Units/hr Intravenous Continuous Dang, Thuy  D, RPH 13 mL/hr at 02/22/22 0946 1,300 Units/hr at 02/22/22 0946   [MAR Hold] HYDROmorphone (DILAUDID) injection 0.5 mg  0.5 mg Intravenous Q3H PRN Opyd, Lavone Neri, MD       [MAR Hold] ondansetron (ZOFRAN) tablet 4 mg  4 mg Oral Q6H PRN Opyd, Lavone Neri, MD       Or   Mitzi Hansen Hold] ondansetron (ZOFRAN) injection 4 mg  4 mg Intravenous Q6H PRN Opyd, Lavone Neri, MD       [MAR Hold] oxyCODONE (Oxy IR/ROXICODONE) immediate release tablet 5 mg  5 mg Oral Q4H PRN Opyd, Lavone Neri, MD       [MAR Hold] senna-docusate (Senokot-S) tablet 1 tablet  1  tablet Oral QHS PRN Opyd, Lavone Neri, MD       [MAR Hold] sodium chloride flush (NS) 0.9 % injection 3 mL  3 mL Intravenous Q12H Opyd, Lavone Neri, MD   3 mL at 02/22/22 1005    REVIEW OF SYSTEMS:  [X]  denotes positive finding, [ ]  denotes negative finding Cardiac  Comments:  Chest pain or chest pressure:    Shortness of breath upon exertion:    Short of breath when lying flat:    Irregular heart rhythm:        Vascular    Pain in calf, thigh, or hip brought on by ambulation:    Pain in feet at night that wakes you up from your sleep:     Blood clot in your veins:    Leg swelling:  x       Pulmonary    Oxygen at home:    Productive cough:     Wheezing:         Neurologic    Sudden weakness in arms or legs:     Sudden numbness in arms or legs:     Sudden onset of difficulty speaking or slurred speech:    Temporary loss of vision in one eye:     Problems with dizziness:         Gastrointestinal    Blood in stool:     Vomited blood:         Genitourinary    Burning when urinating:     Blood in urine:        Psychiatric    Major depression:         Hematologic    Bleeding problems:    Problems with blood clotting too easily:        Skin    Rashes or ulcers:        Constitutional    Fever or chills:    -  PHYSICAL EXAM:   Vitals:   02/21/22 1827 02/21/22 2240 02/22/22 0451 02/22/22 1117  BP:  116/74 121/77   Pulse:  88 85   Resp:  20 20   Temp: 97.7 F (36.5 C) 97.8 F (36.6 C) 98.1 F (36.7 C)   TempSrc: Oral Oral Oral   SpO2:  100% 99% 100%  Weight:      Height:       Body mass index is 38.9 kg/m.  GENERAL: The patient is a well-nourished female, in no acute distress. The vital signs are documented above. CARDIAC: There is a regular rate and rhythm.  VASCULAR: I do not detect carotid bruits. She has a palpable right dorsalis pedis and posterior tibial pulse. She has a biphasic dorsalis pedis and posterior tibial signal on the left. She has  significant left lower extremity  swelling. PULMONARY: There is good air exchange bilaterally without wheezing or rales. ABDOMEN: Soft and non-tender with normal pitched bowel sounds.  MUSCULOSKELETAL: There are no major deformities. NEUROLOGIC: No focal weakness or paresthesias are detected. SKIN: There are no ulcers or rashes noted. PSYCHIATRIC: The patient has a normal affect.  DATA:    CT CHEST: CT of the chest shows acute bilateral pulmonary emboli without evidence of right heart strain.  The clot burden is most extensive in the lower lobes.  CT VENOGRAM ABDOMEN PELVIS: The CT venogram of the abdomen and pelvis shows extensive thrombus which begins in the proximal left common iliac vein and extends down through the external iliac vein into the common femoral vein and saphenofemoral junction  VENOUS DUPLEX: I have reviewed the venous duplex scan that was done today of the left lower extremity.  This shows acute occlusive thrombus involving the common femoral vein, the saphenofemoral junction and the great saphenous vein in the proximal thigh, deep femoral vein, femoral vein, popliteal vein, and posterior tibial and peroneal veins.  LABS: Her creatinine is 0.85.  GFR is greater than 60.  Hemoglobin 13.2.  Platelets 169,000.  Urine pregnancy test is negative.  Broadus John [facility]

## 2022-02-22 NOTE — Care Management (Signed)
  Transition of Care [facility]) Screening Note   Patient Details  Name: Loni Delbridge Date of Birth: [birth year]   Transition of Care [facility]) CM/SW Contact:    Bethena Roys, RN Phone Number: 02/22/2022, 3:27 PM    Transition of Care Department [facility]) has reviewed the patient and no TOC needs have been identified at this time. Benefits check completed for Xarelto and Eliquis. Case Manager will continue to monitor patient advancement through interdisciplinary progression rounds. If new patient transition needs arise, please place a TOC consult.

## 2022-02-22 NOTE — TOC Benefit Eligibility Note (Signed)
Patient Teacher, English as a foreign language completed.    The current 30 day co-pay is, Eliquis Starter pack $4.00 and Xarelto Starter pack $4.00.   The patient is insured through [employer], [place] Patient Advocate Specialist [place] Patient Advocate Team Direct Number: [phone]  Fax: [phone]

## 2022-02-22 NOTE — Progress Notes (Signed)
Patient status post successful left iliofemoral mechanical thrombectomy.  Can start DOAC this evening. Thigh-high compression stockings ordered. Plan for discharge tomorrow pending no issues.  Broadus John MD

## 2022-02-22 NOTE — Progress Notes (Signed)
PROGRESS NOTE  Jody Henson [id number] DOB: [birth year] DOA: 02/21/2022 PCP: [place]   LOS: 1 day   Brief Narrative / Interim history: 31 year old female with prediabetes, lymphedema, comes to the hospital with left leg swelling.  She had an ankle fracture underwent ORIF on 01/10/2022, and she was in her normal state of health up until couple days prior to admission when she noticed swelling throughout the left leg.  Denies any chest pain, denies any shortness of breath.  She came to the hospital, and in the ED she was found to have diffuse left lower extremity DVT, with a CT venogram showing extensive iliofemoral DVT from the left common iliac vein, appearing occlusive from the external iliac vein.  CT angiogram of the chest was also concern for bilateral PE without evidence of right heart strain.  She was started on heparin infusion and vascular surgery was consulted  Subjective / 24h Interval events: She is doing well this morning.  Denies any chest pain, denies any shortness of breath  Assesement and Plan: Principal Problem:   DVT (deep venous thrombosis) (HCC) Active Problems:   Acute pulmonary embolus (HCC)   Principal problem Acute DVT, acute PE-provoked in the setting of recent left ankle surgery about 6 weeks ago.  Vascular surgery consulted, plans for mechanical thrombectomy later on today.  Continue IV heparin.  She seems to be asymptomatic from her PE, no tachycardia, hypotension, hypoxia  Active problems Recent left ankle fracture-status post ORIF January 10, 2022.  She was placed to be nonweightbearing for 6 weeks, and was just about to start physical therapy now.  She is to be weightbearing as tolerated.  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  She would benefit from weight loss.  Scheduled Meds:  sodium chloride flush  3 mL Intravenous Q12H   Continuous Infusions:  sodium chloride 125 mL/hr at 02/22/22 0643   heparin 1,300 Units/hr (02/22/22 0946)   PRN  Meds:.acetaminophen **OR** acetaminophen, HYDROmorphone (DILAUDID) injection, ondansetron **OR** ondansetron (ZOFRAN) IV, oxyCODONE, senna-docusate  Current Outpatient Medications  Medication Instructions   azithromycin (ZITHROMAX) 250-500 mg, See admin instructions   Biotin w/ Vitamins C & E (HAIR/SKIN/NAILS PO) 1 capsule, Oral, Daily   cabergoline (DOSTINEX) 0.25 mg, Oral, See admin instructions, 0.25 mg twice weekly on Monday, Thursday   ibuprofen (ADVIL) 600 mg, Oral, Every 6 hours PRN   naproxen (NAPROSYN) 500 mg, Oral, 2 times daily PRN   ondansetron (ZOFRAN) 4 mg, Oral, Every 8 hours PRN   oxyCODONE-acetaminophen (PERCOCET/ROXICET) 5-325 MG tablet 1 tablet, Oral, Every 4 hours PRN    Diet Orders (From admission, onward)     Start     Ordered   02/22/22 0000  Diet NPO time specified Except for: [employer], Sips with Meds  Diet effective now       Question Answer Comment  Except for Ice Chips   Except for Sips with Meds      02/21/22 2325            DVT prophylaxis:    Lab Results  Component Value Date   PLT 145 (L) 02/22/2022      Code Status: Full Code  Family Communication: No family at bedside  Status is: Inpatient Remains inpatient appropriate because: Severity of illness   Level of care: Telemetry Medical  Consultants:  Vascular surgery  Objective: Vitals:   02/21/22 1800 02/21/22 1827 02/21/22 2240 02/22/22 0451  BP: 114/77  116/74 121/77  Pulse: 75  88 85  Resp: 16  20 20  Temp:  97.7 F (36.5 C) 97.8 F (36.6 C) 98.1 F (36.7 C)  TempSrc:  Oral Oral Oral  SpO2: 100%  100% 99%  Weight:      Height:        Intake/Output Summary (Last 24 hours) at 02/22/2022 0948 Last data filed at 02/22/2022 0946 Gross per 24 hour  Intake 1107.77 ml  Output --  Net 1107.77 ml   Wt Readings from Last 3 Encounters:  02/21/22 109.3 kg  10/05/21 107.5 kg  12/14/19 119.3 kg    Examination:  Constitutional: NAD Eyes: no scleral icterus ENMT: Mucous  membranes are moist.  Neck: normal, supple Respiratory: clear to auscultation bilaterally, no wheezing, no crackles.  Cardiovascular: Regular rate and rhythm, no murmurs / rubs / gallops.  Left lower extremity swelling Abdomen: non distended, no tenderness. Bowel sounds positive.  Musculoskeletal: no clubbing / cyanosis.  Skin: no rashes Neurologic: non focal   Data Reviewed: I have independently reviewed following labs and imaging studies   CBC Recent Labs  Lab 02/21/22 1349 02/22/22 0319  WBC 7.5 7.3  HGB 13.2 11.9*  HCT 39.4 35.7*  PLT 169 145*  MCV 87.2 87.5  MCH 29.2 29.2  MCHC 33.5 33.3  RDW 12.4 12.4  LYMPHSABS 1.2  --   MONOABS 0.4  --   EOSABS 0.1  --   BASOSABS 0.0  --     Recent Labs  Lab 02/21/22 1349 02/22/22 0319  NA 137 138  K 3.9 3.8  CL 107 107  CO2 23 22  GLUCOSE 112* 107*  BUN 12 7  CREATININE 0.85 0.66  CALCIUM 8.8* 8.7*    ------------------------------------------------------------------------------------------------------------------ No results for input(s): "CHOL", "HDL", "LDLCALC", "TRIG", "CHOLHDL", "LDLDIRECT" in the last 72 hours.  No results found for: "HGBA1C" ------------------------------------------------------------------------------------------------------------------ No results for input(s): "TSH", "T4TOTAL", "T3FREE", "THYROIDAB" in the last 72 hours.  Invalid input(s): "FREET3"  Cardiac Enzymes No results for input(s): "CKMB", "TROPONINI", "MYOGLOBIN" in the last 168 hours.  Invalid input(s): "CK" ------------------------------------------------------------------------------------------------------------------    Component Value Date/Time   BNP 29.1 12/14/2019 1813    CBG: No results for input(s): "GLUCAP" in the last 168 hours.  No results found for this or any previous visit (from the past 240 hour(s)).   Radiology Studies: CT Angio Chest PE W/Cm &/Or Wo Cm  Result Date: 02/21/2022 CLINICAL DATA:   Pulmonary embolism (PE) suspected, unknown D-dimer; Deep venous thrombosis (DVT) EXAM: CT ANGIOGRAPHY CHEST WITH CONTRAST CT VENOGRAM ABDOMEN AND PELVIS AND LOWER EXTREMITY BILATERAL TECHNIQUE: Multidetector CT imaging of the chest, abdomen, and pelvis was performed using the standard protocol during bolus administration of intravenous contrast. Multiplanar CT image reconstructions and MIPs were obtained to evaluate the vascular anatomy. Delayed imaging of the abdomen and pelvis was obtained to evaluate the venous structures. RADIATION DOSE REDUCTION: This exam was performed according to the departmental dose-optimization program which includes automated exposure control, adjustment of the mA and/or kV according to patient size and/or use of iterative reconstruction technique. CONTRAST:  132mL OMNIPAQUE IOHEXOL 350 MG/ML SOLN COMPARISON:  Same day ultrasound, CT abdomen pelvis 06/29/2009. FINDINGS: CTA chest: Cardiovascular: Satisfactory opacification of the pulmonary arteries to the segmental level. There are lobar, segmental, and subsegmental pulmonary emboli in the lower lobes. There are subsegmental pulmonary emboli in the upper lobes. There is no CT evidence of right heart strain. RV: LV ratio is 0.9.no pericardial disease. The thoracic aorta is unremarkable. Mediastinum/Nodes: No lymphadenopathy. The thyroid is unremarkable. Small hiatal hernia. Lungs/Pleura:  No focal airspace consolidation. No pleural effusion. No pneumothorax. No suspicious pulmonary nodules. Musculoskeletal: No chest wall abnormality. No acute or significant osseous findings. Review of the MIP images confirms the above findings. CT abdomen pelvis: Hepatobiliary: No focal liver abnormality is seen. Cholelithiasis. No cholecystitis. Pancreas: Unremarkable. No pancreatic ductal dilatation or surrounding inflammatory changes. Spleen: Normal in size without focal abnormality. Adrenals/Urinary Tract: Adrenal glands are unremarkable. No  hydronephrosis or nephrolithiasis. The bladder is minimally distended. Stomach/Bowel: Small hiatal hernia. There is no evidence of bowel obstruction.Normal appendix. Vascular/Lymphatic: There is extensive venous thrombosis extending from the left iliac vein origin, through the common and external iliac vein, femoral vein, popliteal vein, extending into the calf beyond the field of view. There is also thrombosis at the saphenofemoral junction extending into the proximal great saphenous vein with some nonocclusive thrombus seen in the more distal great saphenous vein. There is adjacent soft tissue stranding. The abdominal aorta is unremarkable. Reproductive: There is a large uterine fibroid which measures up to 7.6 by 6.0 cm (series 10, image 72). Other: Trace free fluid in the pelvis.  No hernia.  No free air. Musculoskeletal: No acute osseous abnormality. No suspicious osseous lesion. There is superficial soft tissue swelling of the left lower extremity. IMPRESSION: Acute bilateral pulmonary emboli without CT evidence of right heart strain. Clot burden is most extensive in the lower lobes involving the lobar, segmental, and subsegmental branches. Upper lobe involvement is within the subsegmental branches. Extensive iliofemoral DVT, extending from the left common iliac vein origin the pelvis through the left iliac veins, left femoral vein, left popliteal vein, and into the calf at the edge of the field of view. Clot burden appears occlusive from the level of the external iliac vein distally, with adjacent soft tissue stranding consistent with thrombophlebitis. There is clot at the saphenofemoral junction extending into the great saphenous vein and with additional nonocclusive clot seen within the more distal great saphenous vein. Critical Value/emergent results were called by telephone at the time of interpretation on 02/21/2022 at 6:02 pm to provider Dr. Laverta Baltimore, who verbally acknowledged these results. Electronically  Signed   By: Maurine Simmering M.D.   On: 02/21/2022 18:09   CT VENOGRAM ABD/PELVIS/LOWER EXT BILAT  Result Date: 02/21/2022 CLINICAL DATA:  Pulmonary embolism (PE) suspected, unknown D-dimer; Deep venous thrombosis (DVT) EXAM: CT ANGIOGRAPHY CHEST WITH CONTRAST CT VENOGRAM ABDOMEN AND PELVIS AND LOWER EXTREMITY BILATERAL TECHNIQUE: Multidetector CT imaging of the chest, abdomen, and pelvis was performed using the standard protocol during bolus administration of intravenous contrast. Multiplanar CT image reconstructions and MIPs were obtained to evaluate the vascular anatomy. Delayed imaging of the abdomen and pelvis was obtained to evaluate the venous structures. RADIATION DOSE REDUCTION: This exam was performed according to the departmental dose-optimization program which includes automated exposure control, adjustment of the mA and/or kV according to patient size and/or use of iterative reconstruction technique. CONTRAST:  124mL OMNIPAQUE IOHEXOL 350 MG/ML SOLN COMPARISON:  Same day ultrasound, CT abdomen pelvis 06/29/2009. FINDINGS: CTA chest: Cardiovascular: Satisfactory opacification of the pulmonary arteries to the segmental level. There are lobar, segmental, and subsegmental pulmonary emboli in the lower lobes. There are subsegmental pulmonary emboli in the upper lobes. There is no CT evidence of right heart strain. RV: LV ratio is 0.9.no pericardial disease. The thoracic aorta is unremarkable. Mediastinum/Nodes: No lymphadenopathy. The thyroid is unremarkable. Small hiatal hernia. Lungs/Pleura: No focal airspace consolidation. No pleural effusion. No pneumothorax. No suspicious pulmonary nodules. Musculoskeletal: No chest wall abnormality. No  acute or significant osseous findings. Review of the MIP images confirms the above findings. CT abdomen pelvis: Hepatobiliary: No focal liver abnormality is seen. Cholelithiasis. No cholecystitis. Pancreas: Unremarkable. No pancreatic ductal dilatation or surrounding  inflammatory changes. Spleen: Normal in size without focal abnormality. Adrenals/Urinary Tract: Adrenal glands are unremarkable. No hydronephrosis or nephrolithiasis. The bladder is minimally distended. Stomach/Bowel: Small hiatal hernia. There is no evidence of bowel obstruction.Normal appendix. Vascular/Lymphatic: There is extensive venous thrombosis extending from the left iliac vein origin, through the common and external iliac vein, femoral vein, popliteal vein, extending into the calf beyond the field of view. There is also thrombosis at the saphenofemoral junction extending into the proximal great saphenous vein with some nonocclusive thrombus seen in the more distal great saphenous vein. There is adjacent soft tissue stranding. The abdominal aorta is unremarkable. Reproductive: There is a large uterine fibroid which measures up to 7.6 by 6.0 cm (series 10, image 72). Other: Trace free fluid in the pelvis.  No hernia.  No free air. Musculoskeletal: No acute osseous abnormality. No suspicious osseous lesion. There is superficial soft tissue swelling of the left lower extremity. IMPRESSION: Acute bilateral pulmonary emboli without CT evidence of right heart strain. Clot burden is most extensive in the lower lobes involving the lobar, segmental, and subsegmental branches. Upper lobe involvement is within the subsegmental branches. Extensive iliofemoral DVT, extending from the left common iliac vein origin the pelvis through the left iliac veins, left femoral vein, left popliteal vein, and into the calf at the edge of the field of view. Clot burden appears occlusive from the level of the external iliac vein distally, with adjacent soft tissue stranding consistent with thrombophlebitis. There is clot at the saphenofemoral junction extending into the great saphenous vein and with additional nonocclusive clot seen within the more distal great saphenous vein. Critical Value/emergent results were called by telephone  at the time of interpretation on 02/21/2022 at 6:02 pm to provider Dr. Laverta Baltimore, who verbally acknowledged these results. Electronically Signed   By: Maurine Simmering M.D.   On: 02/21/2022 18:09   US Venous Img Lower  Left (DVT Study)  Result Date: 02/21/2022 CLINICAL DATA:  Left ankle fracture Left thigh pain and swelling EXAM: LEFT LOWER EXTREMITY VENOUS DOPPLER ULTRASOUND TECHNIQUE: Gray-scale sonography with graded compression, as well as color Doppler and duplex ultrasound were performed to evaluate the lower extremity deep venous systems from the level of the common femoral vein and including the common femoral, femoral, profunda femoral, popliteal and calf veins including the posterior tibial, peroneal and gastrocnemius veins when visible. The superficial great saphenous vein was also interrogated. Spectral Doppler was utilized to evaluate flow at rest and with distal augmentation maneuvers in the common femoral, femoral and popliteal veins. COMPARISON:  10/13/2014 FINDINGS: Contralateral Common Femoral Vein: Respiratory phasicity is normal and symmetric with the symptomatic side. No evidence of thrombus. Normal compressibility. Common Femoral Vein: Acute occlusive thrombus. No flow. Noncompressible. Saphenofemoral Junction: Acute occlusive thrombus extending into the origin of the greater saphenous vein. No flow. Noncompressible. Profunda Femoral Vein: Acute occlusive thrombus. No flow. Noncompressible. Femoral Vein: Diffuse acute occlusive thrombus.  Noncompressible. Popliteal Vein: Acute occlusive thrombus.  Noncompressible. Calf Veins: Thrombosed common noncompressible posterior tibial and peroneal veins. Superficial Great Saphenous Vein: No thrombus seen within the greater saphenous vein at the level of the knee. The greater saphenous vein in the proximal thigh is thrombosed. Other Findings:  None. IMPRESSION: Diffuse acute deep venous thrombosis of left lower extremity. Electronically Signed  By: Sharen Heck   Mir M.D.   On: 02/21/2022 15:15     Marzetta Board, MD, PhD [facility]  Between 7 am - 7 pm I am available, please contact me via Amion (for emergencies) or Securechat (non urgent messages)  Between 7 pm - 7 am I am not available, please contact night coverage MD/APP via Amion

## 2022-02-22 NOTE — Op Note (Addendum)
    Patient name: Jody Henson  MRN: [id number] DOB: 28-Oct-1990 Sex: female  02/22/2022 Pre-operative Diagnosis: Left-sided iliofemoral DVT Post-operative diagnosis:  Same Surgeon:  Broadus John, MD Procedure Performed: 1.  Ultrasound-guided micropuncture access of the left popliteal vein 2.  Left lower extremity venogram 3.  Cavogram 4.  Intravascular ultrasound 5.  Mechanical thrombectomy using Inari clot Treiver 6.  Balloon venoplasty 12 x 80 mm 7.  Moderate sedation 55 minutes 8.  Contrast volume 105 mL   Indications: Patient is a 31 year old female with obesity and recent ankle surgery who presented to the hospital with a 1 day history of left lower extremity swelling.  Imaging demonstrated iliofemoral deep venous thrombosis.  Jody Henson was unable to walk without significant discomfort and pain.  After discussing the risk and benefits of mechanical thrombectomy in an effort to improve her pain and ambulation, Jody Henson elected to proceed  Findings:  Left popliteal vein patent, distal femoral vein patent, the remainder of the femoral vein, common femoral vein, external iliac vein, common iliac vein with significant thrombotic burden.  Small amount of thrombus appreciated in the inferior vena cava.  The vena cava was decompressed, likely from dehydration.  Right-sided system widely patent.   Procedure:  The patient was identified in the holding area and taken to room 8.  The patient was placed prone on the operating table.  She was prepped and draped in standard fashion a timeout was performed.  Moderate anesthesia was induced.   The case began with ultrasound-guided micropuncture access of the left popliteal vein.  From this location, a wire was run into the inferior vena cava.  The micropuncture sheath was exchanged for an 8 [place] sheath.  Left lower extremity venography as well as cavogram followed. Next, intravascular ultrasound was brought onto the field to mark the bifurcation,  also demonstrating significant thrombus.  Once marked, a Super Stiff Amplatz wire was parked in the left subclavian vein. Next, the patient was heparinized with 10,000 units IV heparin.   The Inari clot treiver and sheath were brought onto the field.  The popliteal vein access was dilated and sheath for the clot treiver device placed.  Next, the device was delivered into the inferior vena cava and 4 passes were made in clockwise positions at 12:00, 3:00, 6:00, 9:00 in an effort to remove the present thrombus.  Follow-up venogram demonstrated excellent result, however there was residual stenosis appreciated in the left external and common iliac veins.  I elected to balloon venoplasty this using a 12 x 80 mm balloon.  Follow-up venogram demonstrated an improved result.  While there was still tapering, there was no flow-limiting stenosis.  IVUS was used to assess the lesion which had increased in diameter significantly.  Being that the patient's DVT was provoked due to immobility from recent ankle surgery, I elected not to stent the lesion.  Should she have another episode, she would require left iliac vein stenting.  Impression: Successful mechanical thrombectomy of the left iliac and femoral vein with improved luminal size status post venoplasty.     Cassandria Santee, MD [facility] Office: [phone]

## 2022-02-22 NOTE — Progress Notes (Addendum)
ANTICOAGULATION CONSULT NOTE  Pharmacy Consult for heparin Indication: Acute PE and DVT  No Known Allergies  Patient Measurements: Height: 5\' 6"  (167.6 cm) Weight: 109.3 kg (241 lb) IBW/kg (Calculated) : 59.3 Heparin Dosing Weight: 85kg  Vital Signs: Temp: 98.1 F (36.7 C) (09/27 0451) Temp Source: Oral (09/27 0451) BP: 121/77 (09/27 0451) Pulse Rate: 85 (09/27 0451)  Labs: Recent Labs    02/21/22 1349 02/21/22 2328 02/22/22 0319  HGB 13.2  --  11.9*  HCT 39.4  --  35.7*  PLT 169  --  145*  HEPARINUNFRC  --  >1.10* 0.68  CREATININE 0.85  --  0.66     Estimated Creatinine Clearance: 128.7 mL/min (by C-G formula based on SCr of 0.66 mg/dL).  Assessment: 33 yof presented to the ED with LLE pain, found to have a DVT and PE.  Pharmacy consulted to dose IV heparin.   Heparin level now within goal at 0.68 on 1200 units/hr. Will confirm that level at goal. No bleeding issues noted.   Patient now s/p mechanical thrombectomy.   Goal of Therapy:  Heparin level 0.3-0.7 units/ml Monitor platelets by anticoagulation protocol: Yes   Plan:  Continue heparin at 1300 units/hr Recheck heparin level later this evening to confirm at goal  Erin Hearing PharmD., BCPS Clinical Pharmacist 02/22/2022 8:48 AM

## 2022-02-22 NOTE — Progress Notes (Signed)
Echocardiogram 2D Echocardiogram has been performed.  Jody Henson 02/22/2022, 2:52 PM

## 2022-02-22 NOTE — Plan of Care (Signed)
°  Problem: Education: °Goal: Knowledge of General Education information will improve °Description: Including pain rating scale, medication(s)/side effects and non-pharmacologic comfort measures °Outcome: Progressing °  °Problem: Health Behavior/Discharge Planning: °Goal: Ability to manage health-related needs will improve °Outcome: Progressing °  °Problem: Clinical Measurements: °Goal: Ability to maintain clinical measurements within normal limits will improve °Outcome: Progressing °Goal: Will remain free from infection °Outcome: Progressing °Goal: Diagnostic test results will improve °Outcome: Progressing °Goal: Respiratory complications will improve °Outcome: Progressing °Goal: Cardiovascular complication will be avoided °Outcome: Progressing °  °Problem: Activity: °Goal: Risk for activity intolerance will decrease °Outcome: Progressing °  °Problem: Nutrition: °Goal: Adequate nutrition will be maintained °Outcome: Progressing °  °Problem: Coping: °Goal: Level of anxiety will decrease °Outcome: Progressing °  °Problem: Elimination: °Goal: Will not experience complications related to bowel motility °Outcome: Progressing °Goal: Will not experience complications related to urinary retention °Outcome: Progressing °  °Problem: Pain Managment: °Goal: General experience of comfort will improve °Outcome: Progressing °  °Problem: Safety: °Goal: Ability to remain free from injury will improve °Outcome: Progressing °  °Problem: Skin Integrity: °Goal: Risk for impaired skin integrity will decrease °Outcome: Progressing °  °Problem: Consults °Goal: Venous Thromboembolism Patient Education °Description: See Patient Education Module for education specifics. °Outcome: Progressing °Goal: Diagnosis - Venous Thromboembolism (VTE) °Description: Choose a selection °Outcome: Progressing °Goal: Pharmacy Consult for anticoagulation °Outcome: Progressing °Goal: Skin Care Protocol Initiated - if Braden Score 18 or less °Description: If  consults are not indicated, leave blank or document N/A °Outcome: Progressing °Goal: Nutrition Consult-if indicated °Outcome: Progressing °Goal: Diabetes Guidelines if Diabetic/Glucose > 140 °Description: If diabetic or lab glucose is > 140 mg/dl - Initiate Diabetes/Hyperglycemia Guidelines & Document Interventions  °Outcome: Progressing °  °Problem: Phase I Progression Outcomes °Goal: Pain controlled with appropriate interventions °Outcome: Progressing °Goal: Dyspnea controlled at rest (PE) °Outcome: Progressing °Goal: Tolerating diet °Outcome: Progressing °Goal: Initial discharge plan identified °Outcome: Progressing °Goal: Voiding-avoid urinary catheter unless indicated °Outcome: Progressing °Goal: Hemodynamically stable °Outcome: Progressing °Goal: Other Phase I Outcomes/Goals °Outcome: Progressing °  °Problem: Phase II Progression Outcomes °Goal: Therapeutic drug levels for anticoagulation °Outcome: Progressing °Goal: 02 sats trending upward/stable (PE) °Outcome: Progressing °Goal: Discharge plan established °Outcome: Progressing °Goal: Tolerating diet °Outcome: Progressing °Goal: Other Phase II Outcomes/Goals °Outcome: Progressing °  °Problem: Phase III Progression Outcomes °Goal: 02 sats stabilized °Outcome: Progressing °Goal: Activity at appropriate level-compared to baseline °Description: (UP IN CHAIR FOR HEMODIALYSIS) °Outcome: Progressing °Goal: Discharge plan remains appropriate-arrangements made °Outcome: Progressing °Goal: Other Phase III Outcomes/Goals °Outcome: Progressing °  °Problem: Discharge Progression Outcomes °Goal: Barriers To Progression Addressed/Resolved °Outcome: Progressing °Goal: Discharge plan in place and appropriate °Outcome: Progressing °Goal: Pain controlled with appropriate interventions °Outcome: Progressing °Goal: Hemodynamically stable °Outcome: Progressing °Goal: Complications resolved/controlled °Outcome: Progressing °Goal: Tolerating diet °Outcome: Progressing °Goal:  Activity appropriate for discharge plan °Outcome: Progressing °Goal: Other Discharge Outcomes/Goals °Outcome: Progressing °  °

## 2022-02-22 NOTE — Plan of Care (Signed)

## 2022-02-22 NOTE — Telephone Encounter (Signed)
Pharmacy Patient Advocate Encounter  Insurance verification completed.    The patient is insured through [employer]   The patient is currently admitted and ran test claims for the following: Eliquis Starter pack and Xarelto Starter pack.  Copays and coinsurance results were relayed to Inpatient clinical team.

## 2022-02-22 NOTE — Progress Notes (Signed)
[facility] for heparin Indication: Acute PE and DVT  No Known Allergies  Patient Measurements: Height: 5\' 6"  (167.6 cm) Weight: 109.3 kg (241 lb) IBW/kg (Calculated) : 59.3 Heparin Dosing Weight: 85kg  Vital Signs: Temp: 97.8 F (36.6 C) (09/26 2240) Temp Source: Oral (09/26 2240) BP: 116/74 (09/26 2240) Pulse Rate: 88 (09/26 2240)  Labs: Recent Labs    02/21/22 1349 02/21/22 2328  HGB 13.2  --   HCT 39.4  --   PLT 169  --   HEPARINUNFRC  --  >1.10*  CREATININE 0.85  --      Estimated Creatinine Clearance: 121.2 mL/min (by C-G formula based on SCr of 0.85 mg/dL).  Assessment: Jody Henson presented to the ED with LLE pain, found to have a DVT and PE.  Pharmacy consulted to dose IV heparin.   Initial heparin level is elevated at > 1.1 units/mL.  Confirmed with RN that lab was drawn appropriately; no bleeding reported.  Goal of Therapy:  Heparin level 0.3-0.7 units/ml Monitor platelets by anticoagulation protocol: Yes   Plan:  Hold heparin x 1 hr (done ~1215 by RN) At [place], resume IV heparin at 1300 units/hr Check a 6 hr HL   Jody Henson, PharmD, BCPS, [place] 02/22/2022, 12:20 AM

## 2022-02-23 ENCOUNTER — Encounter (HOSPITAL_COMMUNITY): Payer: Self-pay | Admitting: Vascular Surgery

## 2022-02-23 DIAGNOSIS — I82422 Acute embolism and thrombosis of left iliac vein: Secondary | ICD-10-CM | POA: Diagnosis not present

## 2022-02-23 LAB — COMPREHENSIVE METABOLIC PANEL
ALT: 8 U/L (ref 0–44)
AST: 12 U/L — ABNORMAL LOW (ref 15–41)
Albumin: 2.6 g/dL — ABNORMAL LOW (ref 3.5–5.0)
Alkaline Phosphatase: 44 U/L (ref 38–126)
Anion gap: 9 (ref 5–15)
BUN: 11 mg/dL (ref 6–20)
CO2: 22 mmol/L (ref 22–32)
Calcium: 8.8 mg/dL — ABNORMAL LOW (ref 8.9–10.3)
Chloride: 108 mmol/L (ref 98–111)
Creatinine, Ser: 0.83 mg/dL (ref 0.44–1.00)
GFR, Estimated: >60 mL/min (ref >60)
Glucose, Bld: 111 mg/dL — ABNORMAL HIGH (ref 70–99)
Potassium: 3.8 mmol/L (ref 3.5–5.1)
Sodium: 139 mmol/L (ref 135–145)
Total Bilirubin: 0.7 mg/dL (ref 0.3–1.2)
Total Protein: 5.1 g/dL — ABNORMAL LOW (ref 6.5–8.1)

## 2022-02-23 LAB — CBC
HCT: 33.9 % — ABNORMAL LOW (ref 36.0–46.0)
Hemoglobin: 11.4 g/dL — ABNORMAL LOW (ref 12.0–15.0)
MCH: 29.4 pg (ref 26.0–34.0)
MCHC: 33.6 g/dL (ref 30.0–36.0)
MCV: 87.4 fL (ref 80.0–100.0)
Platelets: 126 10*3/uL — ABNORMAL LOW (ref 150–400)
RBC: 3.88 MIL/uL (ref 3.87–5.11)
RDW: 12.4 % (ref 11.5–15.5)
WBC: 5.6 10*3/uL (ref 4.0–10.5)
nRBC: 0 % (ref 0.0–0.2)

## 2022-02-23 LAB — HEPARIN LEVEL (UNFRACTIONATED): Heparin Unfractionated: 0.5 IU/mL (ref 0.30–0.70)

## 2022-02-23 LAB — MAGNESIUM: Magnesium: 1.8 mg/dL (ref 1.7–2.4)

## 2022-02-23 MED ORDER — APIXABAN 5 MG PO TABS
10.0000 mg | ORAL_TABLET | Freq: Two times a day (BID) | ORAL | Status: DC
  Administered 2022-02-23: 10 mg via ORAL
  Filled 2022-02-23: qty 2

## 2022-02-23 MED ORDER — APIXABAN 5 MG PO TABS: 5.0000 mg | ORAL_TABLET | Freq: Two times a day (BID) | ORAL | Status: DC

## 2022-02-23 MED ORDER — APIXABAN 5 MG PO TABS: ORAL_TABLET | ORAL | 0 refills | Status: AC

## 2022-02-23 MED ORDER — METOPROLOL TARTRATE 25 MG PO TABS: 12.5000 mg | ORAL_TABLET | Freq: Two times a day (BID) | ORAL | 1 refills | Status: AC

## 2022-02-23 NOTE — Progress Notes (Signed)
Mobility Specialist - Progress Note   02/23/22 0957  Mobility  Activity Ambulated with assistance in hallway  Level of Assistance Standby assist, set-up cues, supervision of patient - no hands on  Assistive Device Crutches  LLE Weight Bearing WBAT  Distance Ambulated (ft) 80 ft  Activity Response Tolerated well  $Mobility charge 1 Mobility   Pt was received in bed and agreeable to mobility. Pt c/o slight LLE pain throughout ambulation. Pt was returned to bed with all needs met.  Larey Seat

## 2022-02-23 NOTE — Progress Notes (Signed)
ANTICOAGULATION CONSULT NOTE- follow-up  Pharmacy Consult for Heparin Indication: Acute PE and DVT  No Known Allergies  Patient Measurements: Height: 5\' 6"  (167.6 cm) Weight: 109.3 kg (241 lb) IBW/kg (Calculated) : 59.3 Heparin Dosing Weight: 85kg  Vital Signs: Temp: 98.7 F (37.1 C) (09/27 2047) Temp Source: Oral (09/27 2047) BP: 111/85 (09/27 2047) Pulse Rate: 114 (09/27 2047)  Labs: Recent Labs    02/21/22 1349 02/21/22 2328 02/22/22 0319 02/22/22 2014 02/23/22 0232  HGB 13.2  --  11.9*  --  11.4*  HCT 39.4  --  35.7*  --  33.9*  PLT 169  --  145*  --  126*  HEPARINUNFRC  --    < > 0.68 0.90* 0.50  CREATININE 0.85  --  0.66  --   --    < > = values in this interval not displayed.     Estimated Creatinine Clearance: 128.7 mL/min (by C-G formula based on SCr of 0.66 mg/dL).  Assessment: 80 yof presented to the ED with LLE pain, found to have a DVT and PE.  Pharmacy consulted to dose IV heparin.   9/28 AM update:  Heparin level therapeutic after rate decrease  Goal of Therapy:  Heparin level 0.3-0.7 units/ml Monitor platelets by anticoagulation protocol: Yes   Plan:  Cont heparin 1100 units/hr 1200 heparin level  Narda Bonds, PharmD, BCPS Clinical Pharmacist Phone: [phone]

## 2022-02-23 NOTE — Progress Notes (Signed)
ANTICOAGULATION CONSULT NOTE- follow-up  Pharmacy Consult for Heparin>>apixaban  Indication: Acute PE and DVT  No Known Allergies  Patient Measurements: Height: 5\' 6"  (167.6 cm) Weight: 109.3 kg (241 lb) IBW/kg (Calculated) : 59.3 Heparin Dosing Weight: 85kg  Vital Signs: Temp: 98.4 F (36.9 C) (09/28 0448) Temp Source: Oral (09/28 0448) BP: 120/83 (09/28 0448) Pulse Rate: 109 (09/28 0448)  Labs: Recent Labs    02/21/22 1349 02/21/22 2328 02/22/22 0319 02/22/22 2014 02/23/22 0232  HGB 13.2  --  11.9*  --  11.4*  HCT 39.4  --  35.7*  --  33.9*  PLT 169  --  145*  --  126*  HEPARINUNFRC  --    < > 0.68 0.90* 0.50  CREATININE 0.85  --  0.66  --  0.83   < > = values in this interval not displayed.     Estimated Creatinine Clearance: 124.1 mL/min (by C-G formula based on SCr of 0.83 mg/dL).  Assessment: 26 yof presented to the ED with LLE pain, found to have a DVT and PE.  Pharmacy consulted to transition patient over to apixaban. CBC appears stable overnight. No bleeding issues noted.   Apix copay is $4, pharmacy will provide education prior to discharge.   Goal of Therapy:  Heparin level 0.3-0.7 units/ml Monitor platelets by anticoagulation protocol: Yes   Plan:  Transition to apixaban 10mg  bid x 7 days then 5mg  bid  Erin Hearing PharmD., BCPS Clinical Pharmacist 02/23/2022 10:07 AM

## 2022-02-23 NOTE — Discharge Instructions (Signed)
Information on my medicine - ELIQUIS (apixaban)  Why was Eliquis prescribed for you? Eliquis was prescribed to treat blood clots that may have been found in the veins of your legs (deep vein thrombosis) or in your lungs (pulmonary embolism) and to reduce the risk of them occurring again.  What do You need to know about Eliquis ? The starting dose is 10 mg (two 5 mg tablets) taken TWICE daily for the FIRST SEVEN (7) DAYS, then on (enter date)  03/01/22  the dose is reduced to ONE 5 mg tablet taken TWICE daily.  Eliquis may be taken with or without food.   Try to take the dose about the same time in the morning and in the evening. If you have difficulty swallowing the tablet whole please discuss with your pharmacist how to take the medication safely.  Take Eliquis exactly as prescribed and DO NOT stop taking Eliquis without talking to the doctor who prescribed the medication.  Stopping may increase your risk of developing a new blood clot.  Refill your prescription before you run out.  After discharge, you should have regular check-up appointments with your healthcare provider that is prescribing your Eliquis.    What do you do if you miss a dose? If a dose of ELIQUIS is not taken at the scheduled time, take it as soon as possible on the same day and twice-daily administration should be resumed. The dose should not be doubled to make up for a missed dose.  Important Safety Information A possible side effect of Eliquis is bleeding. You should call your healthcare provider right away if you experience any of the following: Bleeding from an injury or your nose that does not stop. Unusual colored urine (red or dark brown) or unusual colored stools (red or black). Unusual bruising for unknown reasons. A serious fall or if you hit your head (even if there is no bleeding).  Some medicines may interact with Eliquis and might increase your risk of bleeding or clotting while on Eliquis. To  help avoid this, consult your healthcare provider or pharmacist prior to using any new prescription or non-prescription medications, including herbals, vitamins, non-steroidal anti-inflammatory drugs (NSAIDs) and supplements.  This website has more information on Eliquis (apixaban): http://[email or web address].com/eliquis/home

## 2022-02-23 NOTE — Discharge Summary (Signed)
Physician Discharge Summary  Jody Henson [id number] DOB: Dec 03, 1990 DOA: 02/21/2022  PCP: [place] date: 02/21/2022 Discharge date: 02/23/2022  Admitted From: home Disposition:  home  Recommendations for Outpatient Follow-up:  Follow up with PCP in 1-2 weeks  Home Health: none Equipment/Devices: walker  Discharge Condition: stable CODE STATUS: Full code  HPI: Per admitting MD, Jody Henson is a pleasant 31 y.o. female who reports a history of prediabetes and lymphedema and now presents to the emergency department for evaluation of left leg swelling.  Patient underwent ORIF of the left ankle on [id number], reports uncomplicated postoperative period, and had been in her usual state until early this morning when she noticed swelling throughout the left leg.  She denies any chest pain, cough, shortness of breath, or lightheadedness.  She has diminished sensation in the left foot ever since her surgery last month.  Hospital Course / Discharge diagnoses: Principal Problem:   DVT (deep venous thrombosis) (HCC) Active Problems:   Acute pulmonary embolus (HCC)  Principal problem Acute DVT, acute PE-provoked in the setting of recent left ankle surgery about 6 weeks ago.  Vascular surgery consulted, she is status post mechanical thrombectomy on 9/27.  She improved well following the procedure, was transitioned from heparin to Eliquis, tolerating it well, and will be discharged home in stable condition.   Active problems Recent left ankle fracture-status post ORIF January 10, 2022.  She was placed to be nonweightbearing for 6 weeks, and was just about to start physical therapy now.  She is to be weightbearing as tolerated.  Did well with PT.  Continue outpatient PT. Sinus tachycardia-rates in the 120s at one-point, this is likely in the setting of PE/recent procedure.  Start low-dose metoprolol, tolerating it well, could potentially be discontinued in a few weeks as  an outpatient. 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  She would benefit from weight loss.  Sepsis ruled out   Discharge Instructions   Allergies as of 02/23/2022   No Known Allergies      Medication List     STOP taking these medications    azithromycin 250 MG tablet Commonly known as: ZITHROMAX   ibuprofen 600 MG tablet Commonly known as: ADVIL       TAKE these medications    apixaban 5 MG Tabs tablet Commonly known as: Eliquis Take 2 tablets (10mg ) twice daily for 6 days, then 1 tablet (5mg ) twice daily   cabergoline 0.5 MG tablet Commonly known as: DOSTINEX Take 0.25 mg by mouth See admin instructions. 0.25 mg twice weekly on Monday, Thursday   HAIR/SKIN/NAILS PO Take 1 capsule by mouth daily.   metoprolol tartrate 25 MG tablet Commonly known as: LOPRESSOR Take 0.5 tablets (12.5 mg total) by mouth 2 (two) times daily.   naproxen 500 MG tablet Commonly known as: NAPROSYN Take 500 mg by mouth 2 (two) times daily as needed for pain.   ondansetron 4 MG tablet Commonly known as: ZOFRAN Take 4 mg by mouth every 8 (eight) hours as needed for nausea/vomiting.   oxyCODONE-acetaminophen 5-325 MG tablet Commonly known as: PERCOCET/ROXICET Take 1 tablet by mouth every 4 (four) hours as needed for pain.         Consultations: Vascular surgery   Procedures/Studies:  ECHOCARDIOGRAM COMPLETE  Result Date: 02/22/2022    ECHOCARDIOGRAM REPORT   Patient Name:   Jody Henson Date of Exam: 02/22/2022 Medical Rec #:  [id number]      Height:       66.0 in  Accession #:    [id number]     Weight:       241.0 lb Date of Birth:  [birth year]     BSA:          2.165 m Patient Age:    30 years       BP:           110/85 mmHg Patient Gender: F              HR:           109 bpm. Exam Location:  Inpatient Procedure: 2D Echo, Cardiac Doppler, Color Doppler and Intracardiac            Opacification Agent Indications:    Pulmonary Embolus I26.09  History:        Patient has no prior history  of Echocardiogram examinations.                 Risk Factors:Hypertension.  Sonographer:    Bernadene Person RDCS Referring Phys: [id number] [place]  1. Left ventricular ejection fraction, by estimation, is 55 to 60%. The left ventricle has normal function. The left ventricle has no regional wall motion abnormalities. Left ventricular diastolic parameters were normal. There is the interventricular septum is flattened in systole, consistent with right ventricular pressure overload.  2. Right ventricular systolic function is normal. The right ventricular size is mildly enlarged. There is normal pulmonary artery systolic pressure.  3. The mitral valve is normal in structure. No evidence of mitral valve regurgitation. No evidence of mitral stenosis.  4. Tricuspid valve regurgitation is moderate to severe.  5. The aortic valve is tricuspid. Aortic valve regurgitation is not visualized. No aortic stenosis is present.  6. The inferior vena cava is normal in size with greater than 50% respiratory variability, suggesting right atrial pressure of 3 mmHg. FINDINGS  Left Ventricle: Left ventricular ejection fraction, by estimation, is 55 to 60%. The left ventricle has normal function. The left ventricle has no regional wall motion abnormalities. Definity contrast agent was given IV to delineate the left ventricular  endocardial borders. The left ventricular internal cavity size was normal in size. There is no left ventricular hypertrophy. The interventricular septum is flattened in systole, consistent with right ventricular pressure overload. Left ventricular diastolic parameters were normal. Right Ventricle: The right ventricular size is mildly enlarged. No increase in right ventricular wall thickness. Right ventricular systolic function is normal. There is normal pulmonary artery systolic pressure. The tricuspid regurgitant velocity is 2.84  m/s, and with an assumed right atrial pressure of 3 mmHg, the  estimated right ventricular systolic pressure is [id number] mmHg. Left Atrium: Left atrial size was normal in size. Right Atrium: Right atrial size was normal in size. Pericardium: There is no evidence of pericardial effusion. Mitral Valve: The mitral valve is normal in structure. No evidence of mitral valve regurgitation. No evidence of mitral valve stenosis. Tricuspid Valve: The tricuspid valve is normal in structure. Tricuspid valve regurgitation is moderate to severe. No evidence of tricuspid stenosis. Aortic Valve: The aortic valve is tricuspid. Aortic valve regurgitation is not visualized. No aortic stenosis is present. Pulmonic Valve: The pulmonic valve was normal in structure. Pulmonic valve regurgitation is not visualized. No evidence of pulmonic stenosis. Aorta: The aortic root is normal in size and structure. Venous: The inferior vena cava is normal in size with greater than 50% respiratory variability, suggesting right atrial pressure of 3 mmHg. IAS/Shunts: No atrial level shunt detected by color  flow Doppler.  LEFT VENTRICLE PLAX 2D LVIDd:         3.70 cm     Diastology LVIDs:         2.50 cm     LV e' medial:    12.10 cm/s LV PW:         1.00 cm     LV E/e' medial:  4.6 LV IVS:        1.00 cm     LV e' lateral:   12.90 cm/s LVOT diam:     2.10 cm     LV E/e' lateral: 4.3 LV SV:         40 LV SV Index:   18 LVOT Area:     3.46 cm  LV Volumes (MOD) LV vol d, MOD A2C: 62.1 ml LV vol d, MOD A4C: 78.5 ml LV vol s, MOD A2C: 28.5 ml LV vol s, MOD A4C: 33.5 ml LV SV MOD A2C:     33.6 ml LV SV MOD A4C:     78.5 ml LV SV MOD BP:      42.1 ml RIGHT VENTRICLE RV S prime:     15.90 cm/s TAPSE (M-mode): 1.7 cm LEFT ATRIUM             Index        RIGHT ATRIUM           Index LA diam:        2.60 cm 1.20 cm/m   RA Area:     16.50 cm LA Vol (A2C):   26.7 ml 12.33 ml/m  RA Volume:   43.80 ml  20.23 ml/m LA Vol (A4C):   21.6 ml 9.98 ml/m LA Biplane Vol: 26.0 ml 12.01 ml/m  AORTIC VALVE LVOT Vmax:   80.60 cm/s LVOT  Vmean:  50.300 cm/s LVOT VTI:    0.115 m  AORTA Ao Root diam: 3.10 cm Ao Asc diam:  2.80 cm MITRAL VALVE               TRICUSPID VALVE MV Area (PHT): 5.42 cm    TR Peak grad:   32.3 mmHg MV Decel Time: 140 msec    TR Vmax:        284.00 cm/s MV E velocity: 55.60 cm/s MV A velocity: 56.80 cm/s  SHUNTS MV E/A ratio:  0.98        Systemic VTI:  0.12 m                            Systemic Diam: 2.10 cm Skeet Latch MD Electronically signed by Skeet Latch MD Signature Date/Time: 02/22/2022/5:45:22 PM    Final    PERIPHERAL VASCULAR CATHETERIZATION  Result Date: 02/22/2022 Images from the original result were not included. Patient name: Brookelyn Bobinski  MRN: [id number] DOB: 29-Dec-1990 Sex: female 02/22/2022 Pre-operative Diagnosis: Left-sided iliofemoral DVT Post-operative diagnosis:  Same Surgeon:  Broadus John, MD Procedure Performed: 1.  Ultrasound-guided micropuncture access of the left popliteal vein 2.  Left lower extremity venogram 3.  Cavogram 4.  Intravascular ultrasound 5.  Mechanical thrombectomy using Inari clot Treiver 6.  Balloon venoplasty 12 x 80 mm 7.  Moderate sedation 55 minutes 8.  Contrast volume 105 mL Indications: Patient is a 31 year old female with obesity and recent ankle surgery who presented to the hospital with a 1 day history of left lower extremity swelling.  Imaging demonstrated iliofemoral deep venous thrombosis.  [place] was  unable to walk without significant discomfort and pain.  After discussing the risk and benefits of mechanical thrombectomy in an effort to improve her pain and ambulation, [place] elected to proceed Findings: Left popliteal vein patent, distal femoral vein patent, the remainder of the femoral vein, common femoral vein, external iliac vein, common iliac vein with significant thrombotic burden.  Small amount of thrombus appreciated in the inferior vena cava.  The vena cava was decompressed, likely from dehydration.  Right-sided system widely patent.   Procedure:  The patient was identified in the holding area and taken to room 8.  The patient was placed prone on the operating table.  She was prepped and draped in standard fashion a timeout was performed.  Moderate anesthesia was induced. The case began with ultrasound-guided micropuncture access of the left popliteal vein.  From this location, a wire was run into the inferior vena cava.  The micropuncture sheath was exchanged for an 8 [place] sheath.  Left lower extremity venography as well as cavogram followed. Next, intravascular ultrasound was brought onto the field to mark the bifurcation, also demonstrating significant thrombus.  Once marked, a Super Stiff Amplatz wire was parked in the left subclavian vein. Next, the patient was heparinized with 10,000 units IV heparin. The Inari clot treiver and sheath were brought onto the field.  The popliteal vein access was dilated and sheath for the clot treiver device placed.  Next, the device was delivered into the inferior vena cava and 4 passes were made in clockwise positions at 12:00, 3:00, 6:00, 9:00 in an effort to remove the present thrombus. Follow-up venogram demonstrated excellent result, however there was residual stenosis appreciated in the left external and common iliac veins.  I elected to balloon venoplasty this using a 12 x 80 mm balloon.  Follow-up venogram demonstrated an improved result.  While there was still tapering, there was no flow-limiting stenosis.  IVUS was used to assess the lesion which had increased in diameter significantly.  Being that the patient's DVT was provoked due to immobility from recent ankle surgery, I elected not to stent the lesion.  Should she have another episode, she would require left iliac vein stenting. Impression: Successful mechanical thrombectomy of the left iliac and femoral vein with improved luminal size status post venoplasty. Cassandria Santee, MD [facility] Office: [phone]    CT Angio Chest PE W/Cm &/Or Wo Cm  Result Date: 02/21/2022 CLINICAL DATA:  Pulmonary embolism (PE) suspected, unknown D-dimer; Deep venous thrombosis (DVT) EXAM: CT ANGIOGRAPHY CHEST WITH CONTRAST CT VENOGRAM ABDOMEN AND PELVIS AND LOWER EXTREMITY BILATERAL TECHNIQUE: Multidetector CT imaging of the chest, abdomen, and pelvis was performed using the standard protocol during bolus administration of intravenous contrast. Multiplanar CT image reconstructions and MIPs were obtained to evaluate the vascular anatomy. Delayed imaging of the abdomen and pelvis was obtained to evaluate the venous structures. RADIATION DOSE REDUCTION: This exam was performed according to the departmental dose-optimization program which includes automated exposure control, adjustment of the mA and/or kV according to patient size and/or use of iterative reconstruction technique. CONTRAST:  118mL OMNIPAQUE IOHEXOL 350 MG/ML SOLN COMPARISON:  Same day ultrasound, CT abdomen pelvis 06/29/2009. FINDINGS: CTA chest: Cardiovascular: Satisfactory opacification of the pulmonary arteries to the segmental level. There are lobar, segmental, and subsegmental pulmonary emboli in the lower lobes. There are subsegmental pulmonary emboli in the upper lobes. There is no CT evidence of right heart strain. RV: LV ratio is 0.9.no pericardial disease. The thoracic  aorta is unremarkable. Mediastinum/Nodes: No lymphadenopathy. The thyroid is unremarkable. Small hiatal hernia. Lungs/Pleura: No focal airspace consolidation. No pleural effusion. No pneumothorax. No suspicious pulmonary nodules. Musculoskeletal: No chest wall abnormality. No acute or significant osseous findings. Review of the MIP images confirms the above findings. CT abdomen pelvis: Hepatobiliary: No focal liver abnormality is seen. Cholelithiasis. No cholecystitis. Pancreas: Unremarkable. No pancreatic ductal dilatation or surrounding inflammatory changes. Spleen: Normal in size without focal  abnormality. Adrenals/Urinary Tract: Adrenal glands are unremarkable. No hydronephrosis or nephrolithiasis. The bladder is minimally distended. Stomach/Bowel: Small hiatal hernia. There is no evidence of bowel obstruction.Normal appendix. Vascular/Lymphatic: There is extensive venous thrombosis extending from the left iliac vein origin, through the common and external iliac vein, femoral vein, popliteal vein, extending into the calf beyond the field of view. There is also thrombosis at the saphenofemoral junction extending into the proximal great saphenous vein with some nonocclusive thrombus seen in the more distal great saphenous vein. There is adjacent soft tissue stranding. The abdominal aorta is unremarkable. Reproductive: There is a large uterine fibroid which measures up to 7.6 by 6.0 cm (series 10, image 72). Other: Trace free fluid in the pelvis.  No hernia.  No free air. Musculoskeletal: No acute osseous abnormality. No suspicious osseous lesion. There is superficial soft tissue swelling of the left lower extremity. IMPRESSION: Acute bilateral pulmonary emboli without CT evidence of right heart strain. Clot burden is most extensive in the lower lobes involving the lobar, segmental, and subsegmental branches. Upper lobe involvement is within the subsegmental branches. Extensive iliofemoral DVT, extending from the left common iliac vein origin the pelvis through the left iliac veins, left femoral vein, left popliteal vein, and into the calf at the edge of the field of view. Clot burden appears occlusive from the level of the external iliac vein distally, with adjacent soft tissue stranding consistent with thrombophlebitis. There is clot at the saphenofemoral junction extending into the great saphenous vein and with additional nonocclusive clot seen within the more distal great saphenous vein. Critical Value/emergent results were called by telephone at the time of interpretation on 02/21/2022 at 6:02 pm to  provider Dr. Laverta Baltimore, who verbally acknowledged these results. Electronically Signed   By: Maurine Simmering M.D.   On: 02/21/2022 18:09   CT VENOGRAM ABD/PELVIS/LOWER EXT BILAT  Result Date: 02/21/2022 CLINICAL DATA:  Pulmonary embolism (PE) suspected, unknown D-dimer; Deep venous thrombosis (DVT) EXAM: CT ANGIOGRAPHY CHEST WITH CONTRAST CT VENOGRAM ABDOMEN AND PELVIS AND LOWER EXTREMITY BILATERAL TECHNIQUE: Multidetector CT imaging of the chest, abdomen, and pelvis was performed using the standard protocol during bolus administration of intravenous contrast. Multiplanar CT image reconstructions and MIPs were obtained to evaluate the vascular anatomy. Delayed imaging of the abdomen and pelvis was obtained to evaluate the venous structures. RADIATION DOSE REDUCTION: This exam was performed according to the departmental dose-optimization program which includes automated exposure control, adjustment of the mA and/or kV according to patient size and/or use of iterative reconstruction technique. CONTRAST:  123mL OMNIPAQUE IOHEXOL 350 MG/ML SOLN COMPARISON:  Same day ultrasound, CT abdomen pelvis 06/29/2009. FINDINGS: CTA chest: Cardiovascular: Satisfactory opacification of the pulmonary arteries to the segmental level. There are lobar, segmental, and subsegmental pulmonary emboli in the lower lobes. There are subsegmental pulmonary emboli in the upper lobes. There is no CT evidence of right heart strain. RV: LV ratio is 0.9.no pericardial disease. The thoracic aorta is unremarkable. Mediastinum/Nodes: No lymphadenopathy. The thyroid is unremarkable. Small hiatal hernia. Lungs/Pleura: No focal airspace consolidation. No  pleural effusion. No pneumothorax. No suspicious pulmonary nodules. Musculoskeletal: No chest wall abnormality. No acute or significant osseous findings. Review of the MIP images confirms the above findings. CT abdomen pelvis: Hepatobiliary: No focal liver abnormality is seen. Cholelithiasis. No cholecystitis.  Pancreas: Unremarkable. No pancreatic ductal dilatation or surrounding inflammatory changes. Spleen: Normal in size without focal abnormality. Adrenals/Urinary Tract: Adrenal glands are unremarkable. No hydronephrosis or nephrolithiasis. The bladder is minimally distended. Stomach/Bowel: Small hiatal hernia. There is no evidence of bowel obstruction.Normal appendix. Vascular/Lymphatic: There is extensive venous thrombosis extending from the left iliac vein origin, through the common and external iliac vein, femoral vein, popliteal vein, extending into the calf beyond the field of view. There is also thrombosis at the saphenofemoral junction extending into the proximal great saphenous vein with some nonocclusive thrombus seen in the more distal great saphenous vein. There is adjacent soft tissue stranding. The abdominal aorta is unremarkable. Reproductive: There is a large uterine fibroid which measures up to 7.6 by 6.0 cm (series 10, image 72). Other: Trace free fluid in the pelvis.  No hernia.  No free air. Musculoskeletal: No acute osseous abnormality. No suspicious osseous lesion. There is superficial soft tissue swelling of the left lower extremity. IMPRESSION: Acute bilateral pulmonary emboli without CT evidence of right heart strain. Clot burden is most extensive in the lower lobes involving the lobar, segmental, and subsegmental branches. Upper lobe involvement is within the subsegmental branches. Extensive iliofemoral DVT, extending from the left common iliac vein origin the pelvis through the left iliac veins, left femoral vein, left popliteal vein, and into the calf at the edge of the field of view. Clot burden appears occlusive from the level of the external iliac vein distally, with adjacent soft tissue stranding consistent with thrombophlebitis. There is clot at the saphenofemoral junction extending into the great saphenous vein and with additional nonocclusive clot seen within the more distal great  saphenous vein. Critical Value/emergent results were called by telephone at the time of interpretation on 02/21/2022 at 6:02 pm to provider Dr. Jacqulyn Bath, who verbally acknowledged these results. Electronically Signed   By: Caprice Renshaw M.D.   On: 02/21/2022 18:09   US Venous Img Lower  Left (DVT Study)  Result Date: 02/21/2022 CLINICAL DATA:  Left ankle fracture Left thigh pain and swelling EXAM: LEFT LOWER EXTREMITY VENOUS DOPPLER ULTRASOUND TECHNIQUE: Gray-scale sonography with graded compression, as well as color Doppler and duplex ultrasound were performed to evaluate the lower extremity deep venous systems from the level of the common femoral vein and including the common femoral, femoral, profunda femoral, popliteal and calf veins including the posterior tibial, peroneal and gastrocnemius veins when visible. The superficial great saphenous vein was also interrogated. Spectral Doppler was utilized to evaluate flow at rest and with distal augmentation maneuvers in the common femoral, femoral and popliteal veins. COMPARISON:  10/13/2014 FINDINGS: Contralateral Common Femoral Vein: Respiratory phasicity is normal and symmetric with the symptomatic side. No evidence of thrombus. Normal compressibility. Common Femoral Vein: Acute occlusive thrombus. No flow. Noncompressible. Saphenofemoral Junction: Acute occlusive thrombus extending into the origin of the greater saphenous vein. No flow. Noncompressible. Profunda Femoral Vein: Acute occlusive thrombus. No flow. Noncompressible. Femoral Vein: Diffuse acute occlusive thrombus.  Noncompressible. Popliteal Vein: Acute occlusive thrombus.  Noncompressible. Calf Veins: Thrombosed common noncompressible posterior tibial and peroneal veins. Superficial Great Saphenous Vein: No thrombus seen within the greater saphenous vein at the level of the knee. The greater saphenous vein in the proximal thigh is thrombosed. Other Findings:  None. IMPRESSION: Diffuse acute deep venous  thrombosis of left lower extremity. Electronically Signed   By: Miachel Roux M.D.   On: 02/21/2022 15:15     Subjective: - no chest pain, shortness of breath, no abdominal pain, nausea or vomiting.   Discharge Exam: BP 114/77 (BP Location: Right Arm)   Pulse 98   Temp 98.1 F (36.7 C) (Oral)   Resp 19   Ht 5\' 6"  (1.676 m)   Wt 109.3 kg   LMP 01/31/2022   SpO2 98%   BMI 38.90 kg/m   General: Pt is alert, awake, not in acute distress Cardiovascular: RRR, S1/S2 +, no rubs, no gallops Respiratory: CTA bilaterally, no wheezing, no rhonchi Abdominal: Soft, NT, ND, bowel sounds + Extremities: no edema, no cyanosis    The results of significant diagnostics from this hospitalization (including imaging, microbiology, ancillary and laboratory) are listed below for reference.     Microbiology: No results found for this or any previous visit (from the past 240 hour(s)).   Labs: Basic Metabolic Panel: Recent Labs  Lab 02/21/22 1349 02/22/22 0319 02/23/22 0232  NA 137 138 139  K 3.9 3.8 3.8  CL 107 107 108  CO2 23 22 22   GLUCOSE 112* 107* 111*  BUN 12 7 11   CREATININE 0.85 0.66 0.83  CALCIUM 8.8* 8.7* 8.8*  MG  --   --  1.8   Liver Function Tests: Recent Labs  Lab 02/23/22 0232  AST 12*  ALT 8  ALKPHOS 44  BILITOT 0.7  PROT 5.1*  ALBUMIN 2.6*   CBC: Recent Labs  Lab 02/21/22 1349 02/22/22 0319 02/23/22 0232  WBC 7.5 7.3 5.6  NEUTROABS 5.8  --   --   HGB 13.2 11.9* 11.4*  HCT 39.4 35.7* 33.9*  MCV 87.2 87.5 87.4  PLT 169 145* 126*   CBG: No results for input(s): "GLUCAP" in the last 168 hours. Hgb A1c No results for input(s): "HGBA1C" in the last 72 hours. Lipid Profile No results for input(s): "CHOL", "HDL", "LDLCALC", "TRIG", "CHOLHDL", "LDLDIRECT" in the last 72 hours. Thyroid function studies No results for input(s): "TSH", "T4TOTAL", "T3FREE", "THYROIDAB" in the last 72 hours.  Invalid input(s): "FREET3" Urinalysis    Component Value  Date/Time   COLORURINE YELLOW 03/02/2018 [place] 03/02/2018 1510   LABSPEC >1.030 (H) 03/02/2018 1510   PHURINE 6.0 03/02/2018 1510   GLUCOSEU NEGATIVE 03/02/2018 1510   HGBUR NEGATIVE 03/02/2018 1510   BILIRUBINUR NEGATIVE 03/02/2018 1510   KETONESUR 15 (A) 03/02/2018 1510   PROTEINUR NEGATIVE 03/02/2018 1510   UROBILINOGEN 1.0 10/12/2014 2305   NITRITE NEGATIVE 03/02/2018 1510   LEUKOCYTESUR TRACE (A) 03/02/2018 1510    FURTHER DISCHARGE INSTRUCTIONS:   Get Medicines reviewed and adjusted: Please take all your medications with you for your next visit with your Primary MD   Laboratory/radiological data: Please request your Primary MD to go over all hospital tests and procedure/radiological results at the follow up, please ask your Primary MD to get all Hospital records sent to his/her office.   In some cases, they will be blood work, cultures and biopsy results pending at the time of your discharge. Please request that your primary care M.D. goes through all the records of your hospital data and follows up on these results.   Also Note the following: If you experience worsening of your admission symptoms, develop shortness of breath, life threatening emergency, suicidal or homicidal thoughts you must seek medical attention immediately by calling  [phone] or calling your MD immediately  if symptoms less severe.   You must read complete instructions/literature along with all the possible adverse reactions/side effects for all the Medicines you take and that have been prescribed to you. Take any new Medicines after you have completely understood and accpet all the possible adverse reactions/side effects.    Do not drive when taking Pain medications or sleeping medications (Benzodaizepines)   Do not take more than prescribed Pain, Sleep and Anxiety Medications. It is not advisable to combine anxiety,sleep and pain medications without talking with your primary care  practitioner   Special Instructions: If you have smoked or chewed Tobacco  in the last 2 yrs please stop smoking, stop any regular Alcohol  and or any Recreational drug use.   Wear Seat belts while driving.   Please note: You were cared for by a hospitalist during your hospital stay. Once you are discharged, your primary care physician will handle any further medical issues. Please note that NO REFILLS for any discharge medications will be authorized once you are discharged, as it is imperative that you return to your primary care physician (or establish a relationship with a primary care physician if you do not have one) for your post hospital discharge needs so that they can reassess your need for medications and monitor your lab values.  Time coordinating discharge: 35 minutes  SIGNED:  Marzetta Board, MD, PhD 02/23/2022, 12:13 PM

## 2022-02-23 NOTE — Evaluation (Signed)
Physical Therapy Evaluation Patient Details Name: Jody Henson MRN: [id number] DOB: [birth year] Today's Date: 02/23/2022  History of Present Illness  Pt is a 31 y/o female admitted secondary to LLE swelling. Found to have LLE DVT and PE. Pt is s/p mechanical thrombectomy on 9/27. Pt with recent L ankle ORIF in early August of 2023.  Clinical Impression  Pt admitted secondary to problem above with deficits below. Pt tolerated ambulation well. Reports increased comfort using RW vs crutch, so recommending RW for use at home. Pt working with outpatient and recommend continuing once cleared by MD. Will continue to follow acutely.        Recommendations for follow up therapy are one component of a multi-disciplinary discharge planning process, led by the attending physician.  Recommendations may be updated based on patient status, additional functional criteria and insurance authorization.  Follow Up Recommendations Outpatient PT (continue outpatient PT)      Assistance Recommended at Discharge Intermittent Supervision/Assistance  Patient can return home with the following  Help with stairs or ramp for entrance;Assist for transportation;Assistance with cooking/housework    Equipment Recommendations Rolling walker (2 wheels)  Recommendations for Other Services       Functional Status Assessment Patient has had a recent decline in their functional status and demonstrates the ability to make significant improvements in function in a reasonable and predictable amount of time.     Precautions / Restrictions Precautions Precautions: Fall Required Braces or Orthoses: Other Brace Other Brace: CAM boot Restrictions Weight Bearing Restrictions: Yes LLE Weight Bearing: Weight bearing as tolerated (per pt she is WBAT)      Mobility  Bed Mobility Overal bed mobility: Needs Assistance Bed Mobility: Supine to Sit, Sit to Supine     Supine to sit: Min assist Sit to supine: Min assist    General bed mobility comments: Light assist for LLE    Transfers Overall transfer level: Needs assistance Equipment used: Rolling walker (2 wheels) Transfers: Sit to/from Stand Sit to Stand: Min guard           General transfer comment: Min guard for safety.    Ambulation/Gait Ambulation/Gait assistance: Min guard Gait Distance (Feet): 50 Feet Assistive device: Rolling walker (2 wheels) Gait Pattern/deviations: Step-to pattern, Decreased step length - right, Decreased step length - left, Decreased weight shift to left Gait velocity: Decreased     General Gait Details: Slow, antalgic gait. Min guard for safety. Pt reporting increased comfort with use of RW vs crutch.  Stairs            Wheelchair Mobility    Modified Rankin (Stroke Patients Only)       Balance Overall balance assessment: Needs assistance Sitting-balance support: No upper extremity supported, Feet supported Sitting balance-Leahy Scale: Fair     Standing balance support: Bilateral upper extremity supported Standing balance-Leahy Scale: Poor Standing balance comment: reliant on BUE support                             Pertinent Vitals/Pain Pain Assessment Pain Assessment: Faces Faces Pain Scale: Hurts even more Pain Location: LLE Pain Descriptors / Indicators: Grimacing, Guarding Pain Intervention(s): Limited activity within patient's tolerance, Monitored during session, Repositioned    Home Living Family/patient expects to be discharged to:: Private residence Living Arrangements: Children;Other relatives (son and sister) Available Help at Discharge: Family Type of Home: Apartment Home Access: Stairs to enter Entrance Stairs-Rails: Right;Left Entrance Stairs-Number of Steps: 2 flights  Home Layout: One level Home Equipment: Crutches;Shower seat;Other (comment) (knee scooter)      Prior Function Prior Level of Function : Independent/Modified Independent              Mobility Comments: knee scooter for mobility. HAs been working with PT on ambulating with crutch ADLs Comments: reports independence     Hand Dominance        Extremity/Trunk Assessment   Upper Extremity Assessment Upper Extremity Assessment: Overall WFL for tasks assessed    Lower Extremity Assessment Lower Extremity Assessment: LLE deficits/detail LLE Deficits / Details: deficits consistent with post op pain and weakness.    Cervical / Trunk Assessment Cervical / Trunk Assessment: Normal  Communication   Communication: No difficulties  Cognition Arousal/Alertness: Awake/alert Behavior During Therapy: WFL for tasks assessed/performed Overall Cognitive Status: Within Functional Limits for tasks assessed                                          General Comments      Exercises     Assessment/Plan    PT Assessment Patient needs continued PT services  PT Problem List Decreased range of motion;Decreased mobility;Decreased activity tolerance;Pain       PT Treatment Interventions DME instruction;Gait training;Stair training;Functional mobility training;Therapeutic activities;Therapeutic exercise;Balance training;Patient/family education    PT Goals (Current goals can be found in the Care Plan section)  Acute Rehab PT Goals Patient Stated Goal: to go home PT Goal Formulation: With patient Time For Goal Achievement: 03/09/22 Potential to Achieve Goals: Good    Frequency Min 3X/week     Co-evaluation               AM-PAC PT "6 Clicks" Mobility  Outcome Measure Help needed turning from your back to your side while in a flat bed without using bedrails?: A Little Help needed moving from lying on your back to sitting on the side of a flat bed without using bedrails?: A Little Help needed moving to and from a bed to a chair (including a wheelchair)?: A Little Help needed standing up from a chair using your arms (e.g., wheelchair or bedside  chair)?: A Little Help needed to walk in hospital room?: A Little Help needed climbing 3-5 steps with a railing? : A Little 6 Click Score: 18    End of Session Equipment Utilized During Treatment: Gait belt;Other (comment) (cam boot) Activity Tolerance: Patient tolerated treatment well Patient left: in bed;with call bell/phone within reach Nurse Communication: Mobility status PT Visit Diagnosis: Other abnormalities of gait and mobility (R26.89);Pain Pain - Right/Left: Left Pain - part of body: Leg    Time: [id number] PT Time Calculation (min) (ACUTE ONLY): 20 min   Charges:   PT Evaluation $PT Eval Moderate Complexity: 1 Mod          Reuel Derby, PT, DPT  Acute Rehabilitation Services  Office: [phone]   Rudean Hitt 02/23/2022, 1:48 PM

## 2022-02-23 NOTE — Care Management (Signed)
1216 02-23-22 Rolling Walker from [place] will be delivered to the room prior to discharge.

## 2022-02-23 NOTE — Progress Notes (Addendum)
   VASCULAR SURGERY ASSESSMENT & PLAN:   POD 1 S/P MECHANICAL THROMBECTOMY OF LEFT LOWER EXTREMITY DVT WITH BALLOON ANGIOPLASTY OF THE LEFT COMMON AND EXTERNAL ILIAC VEIN: The patient states that the swelling in her leg is already improved.  She is doing an excellent job of elevating her leg and she has her compression dressing on.  Compression stockings have been ordered.  She is currently on IV heparin.  She can start a DOAC today.  I have arranged a follow-up visit with me in 3 months with a venous duplex scan at that time.  If this looks good we could stop her DOAC at that time given that this was a provoked DVT.   SUBJECTIVE:   No complaints this morning.  No chest pain or shortness of breath.  PHYSICAL EXAM:   Vitals:   02/22/22 1330 02/22/22 1652 02/22/22 2047 02/23/22 0448  BP:  120/81 111/85 120/83  Pulse:  (!) 124 (!) 114 (!) 109  Resp: 20  20 (!) 23  Temp:   98.7 F (37.1 C) 98.4 F (36.9 C)  TempSrc:   Oral Oral  SpO2:   98% 98%  Weight:      Height:       Her leg is wrapped. Lungs are clear.  LABS:   Lab Results  Component Value Date   WBC 5.6 02/23/2022   HGB 11.4 (L) 02/23/2022   HCT 33.9 (L) 02/23/2022   MCV 87.4 02/23/2022   PLT 126 (L) 02/23/2022   Lab Results  Component Value Date   CREATININE 0.83 02/23/2022   No results found for: "INR", "PROTIME" CBG (last 3)  No results for input(s): "GLUCAP" in the last 72 hours.  PROBLEM LIST:    Principal Problem:   DVT (deep venous thrombosis) (HCC) Active Problems:   Acute pulmonary embolus (HCC)   CURRENT MEDS:    metoprolol tartrate  12.5 mg Oral BID   sodium chloride flush  3 mL Intravenous Q12H    Deitra Mayo Office: [phone] 02/23/2022

## 2022-02-27 ENCOUNTER — Telehealth: Payer: Self-pay

## 2022-02-27 NOTE — Telephone Encounter (Signed)
Pt called requesting a return call.  Reviewed pt's chart, returned call for clarification, two identifiers used. The pt had some questions concerning drinking alcohol while on Eliquis, her period beginning a few days early with an increase in bleeding, how long she will be on Metoprolol, and having a tooth pulled. All questions answered with guidance towards proper providers to assist. Confirmed understanding.

## 2022-03-09 ENCOUNTER — Telehealth: Payer: Self-pay

## 2022-03-09 NOTE — Telephone Encounter (Signed)
Pt called stating that she has a lump at the back of her leg.  Reviewed pt's chart, returned call for clarification, two identifiers used. Pt explained that the knot was very small, decreased in size since the surgery. She denies any pain, redness, or hardened areas. Pt was reassured that it is likely a stitch or scar tissue. Instructed pt to monitor and call if area worsens. Confirmed understanding.

## 2022-03-13 ENCOUNTER — Telehealth: Payer: Self-pay

## 2022-03-13 NOTE — Telephone Encounter (Signed)
Pt called stating that the knot at the back of her leg is concerning.  Reviewed pt's chart, returned call for clarification, two identifiers used. Pt states that it changes size sometimes and she has pain when she fully extends her leg. Pt is concerned that it may be significant following her surgery. Appt scheduled to see PA. Confirmed understanding.

## 2022-03-14 ENCOUNTER — Ambulatory Visit: Payer: Medicaid Other

## 2022-04-03 ENCOUNTER — Telehealth: Payer: Self-pay

## 2022-04-03 NOTE — Telephone Encounter (Signed)
Pt called stating that she has a tooth infection and had questions regarding an extraction while on blood thinners.  Reviewed pt's chart, returned call for clarification, two identifiers used. Pt stated that the dentist told her that she wouldn't need to stop Eliquis unless she were having more than 3 teeth removed and this is only one. She wanted to know if it would interfere with the anesthesia. She also had questions regarding her Metoprolol and her cardiac arrhythmias. She was directed to her PCP since she was supposed to f/u with him/her after d/c and hasn't yet. Confirmed understanding.

## 2022-04-04 ENCOUNTER — Telehealth: Payer: Self-pay

## 2022-04-04 NOTE — Progress Notes (Deleted)
[facility] [place]     History of Present Illness  Jody Henson is a 31 y.o. female who presents for f/u.  She has recent history of left ankle surgery and on post op day 1 she developed left LE edema.  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  She was taken to [facility] on 02/22/22 where Dr. Karin Lieu performed Mechanical thrombectomy and  Balloon venoplasty.  She was started on DOAC and placed in thigh high compression 20-30 mm hg.       Past Medical History:  Diagnosis Date   Hypertension affecting pregnancy     Past Surgical History:  Procedure Laterality Date   ANKLE FRACTURE SURGERY Left    DENTAL SURGERY     DILATION AND CURETTAGE OF UTERUS     IVC VENOGRAPHY N/A 02/22/2022   Procedure: IVC Venography;  Surgeon: Victorino Sparrow, MD;  Location: [facility] INVASIVE CV LAB;  Service: Cardiovascular;  Laterality: N/A;   LOWER EXTREMITY VENOGRAPHY  02/22/2022   Procedure: LOWER EXTREMITY VENOGRAPHY;  Surgeon: Victorino Sparrow, MD;  Location: [facility] INVASIVE CV LAB;  Service: Cardiovascular;;   PERIPHERAL VASCULAR THROMBECTOMY Left 02/22/2022   Procedure: PERIPHERAL VASCULAR THROMBECTOMY;  Surgeon: Victorino Sparrow, MD;  Location: [facility] INVASIVE CV LAB;  Service: Cardiovascular;  Laterality: Left;    Social History   Socioeconomic History   Marital status: Single    Spouse name: Not on file   Number of children: Not on file   Years of education: Not on file   Highest education level: Not on file  Occupational History   Not on file  Tobacco Use   Smoking status: Former    Types: Cigars   Smokeless tobacco: Never  Vaping Use   Vaping Use: Former  Substance and Sexual Activity   Alcohol use: Yes   Drug use: No   Sexual activity: Yes    Birth control/protection: None  Other Topics Concern   Not on file  Social History Narrative   Not on file   Social Determinants of Health   Financial Resource Strain: Not on file  Food Insecurity: No Food  Insecurity (02/22/2022)   Hunger Vital Sign    Worried About Running Out of Food in the Last Year: Never true    Ran Out of Food in the Last Year: Never true  Transportation Needs: Unknown (02/22/2022)   PRAPARE - Administrator, Civil Service (Medical): No    Lack of Transportation (Non-Medical): Not on file  Physical Activity: Not on file  Stress: Not on file  Social Connections: Not on file  Intimate Partner Violence: Not At Risk (02/22/2022)   Humiliation, Afraid, Rape, and Kick questionnaire    Fear of Current or Ex-Partner: No    Emotionally Abused: No    Physically Abused: No    Sexually Abused: No    No family history on file.  Current Outpatient Medications on File Prior to Visit  Medication Sig Dispense Refill   apixaban (ELIQUIS) 5 MG TABS tablet Take 2 tablets (10mg ) twice daily for 6 days, then 1 tablet (5mg ) twice daily 60 tablet 0   Biotin w/ Vitamins C & E (HAIR/SKIN/NAILS PO) Take 1 capsule by mouth daily.     cabergoline (DOSTINEX) 0.5 MG tablet Take 0.25 mg by mouth See admin instructions. 0.25 mg twice weekly on Monday, Thursday     metoprolol tartrate (LOPRESSOR) 25 MG tablet Take 0.5 tablets (12.5 mg total) by  mouth 2 (two) times daily. 30 tablet 1   naproxen (NAPROSYN) 500 MG tablet Take 500 mg by mouth 2 (two) times daily as needed for pain.     ondansetron (ZOFRAN) 4 MG tablet Take 4 mg by mouth every 8 (eight) hours as needed for nausea/vomiting.     oxyCODONE-acetaminophen (PERCOCET/ROXICET) 5-325 MG tablet Take 1 tablet by mouth every 4 (four) hours as needed for pain.     No current facility-administered medications on file prior to visit.    Allergies as of 04/05/2022   (No Known Allergies)     ROS:   General:  No weight loss, Fever, chills  HEENT: No recent headaches, no nasal bleeding, no visual changes, no sore throat  Neurologic: No dizziness, blackouts, seizures. No recent symptoms of stroke or mini- stroke. No recent episodes of  slurred speech, or temporary blindness.  Cardiac: No recent episodes of chest pain/pressure, no shortness of breath at rest.  No shortness of breath with exertion.  Denies history of atrial fibrillation or irregular heartbeat  Vascular: No history of rest pain in feet.  No history of claudication.  No history of non-healing ulcer, No history of DVT   Pulmonary: No home oxygen, no productive cough, no hemoptysis,  No asthma or wheezing  Musculoskeletal:  [ ]  Arthritis, [ ]  Low back pain,  [ ]  Joint pain  Hematologic:No history of hypercoagulable state.  No history of easy bleeding.  No history of anemia  Gastrointestinal: No hematochezia or melena,  No gastroesophageal reflux, no trouble swallowing  Urinary: [ ]  chronic Kidney disease, [ ]  on HD - [ ]  MWF or [ ]  TTHS, [ ]  Burning with urination, [ ]  Frequent urination, [ ]  Difficulty urinating;   Skin: No rashes  Psychological: No history of anxiety,  No history of depression  Physical Examination  There were no vitals filed for this visit.  There is no height or weight on file to calculate BMI.  General:  Alert and oriented, no acute distress HEENT: Normal Neck: No bruit or JVD Pulmonary: Clear to auscultation bilaterally Cardiac: Regular Rate and Rhythm without murmur Abdomen: Soft, non-tender, non-distended, no mass, no scars Skin: No rash Extremity Pulses:  2+ radial, brachial, femoral, dorsalis pedis, posterior tibial pulses bilaterally Musculoskeletal: No deformity or edema  Neurologic: Upper and lower extremity motor 5/5 and symmetric  DATA:  Assessment:  Plan:  Ruta Hinds, MD [facility] [place] Office: [phone] Pager: [phone]

## 2022-04-04 NOTE — Telephone Encounter (Signed)
Pt called stating that her foot was red and swollen.  Reviewed pt's chart, returned call for clarification, two identifiers used. Pt has concerns about foot since it had all of a sudden become red and swollen. Pt has had several concerns and is requesting an appt to reassure her. Appt scheduled. Confirmed understanding.

## 2022-04-05 ENCOUNTER — Telehealth: Payer: Self-pay

## 2022-04-05 ENCOUNTER — Ambulatory Visit: Payer: Medicaid Other

## 2022-04-05 NOTE — Telephone Encounter (Signed)
Pt called to r/s her appt today due to having a tooth extraction yesterday and states she is in too much pain from that to come in today. She was offered an appt this week and stated she wants to come next wed. Instead. Appt has been made-pt is aware of date/time.

## 2022-04-12 ENCOUNTER — Ambulatory Visit: Payer: Medicaid Other

## 2022-04-17 ENCOUNTER — Ambulatory Visit: Payer: Medicaid Other

## 2022-04-17 NOTE — Progress Notes (Deleted)
[facility] [place]     History of Present Illness  Jody Henson is a 31 y.o. female who presents with chief complaint: swollen left leg.   Past Medical History:  Diagnosis Date   Hypertension affecting pregnancy     Past Surgical History:  Procedure Laterality Date   ANKLE FRACTURE SURGERY Left    DENTAL SURGERY     DILATION AND CURETTAGE OF UTERUS     IVC VENOGRAPHY N/A 02/22/2022   Procedure: IVC Venography;  Surgeon: Victorino Sparrow, MD;  Location: [facility] INVASIVE CV LAB;  Service: Cardiovascular;  Laterality: N/A;   LOWER EXTREMITY VENOGRAPHY  02/22/2022   Procedure: LOWER EXTREMITY VENOGRAPHY;  Surgeon: Victorino Sparrow, MD;  Location: [facility] INVASIVE CV LAB;  Service: Cardiovascular;;   PERIPHERAL VASCULAR THROMBECTOMY Left 02/22/2022   Procedure: PERIPHERAL VASCULAR THROMBECTOMY;  Surgeon: Victorino Sparrow, MD;  Location: [facility] INVASIVE CV LAB;  Service: Cardiovascular;  Laterality: Left;    Social History   Socioeconomic History   Marital status: Single    Spouse name: Not on file   Number of children: Not on file   Years of education: Not on file   Highest education level: Not on file  Occupational History   Not on file  Tobacco Use   Smoking status: Former    Types: Cigars   Smokeless tobacco: Never  Vaping Use   Vaping Use: Former  Substance and Sexual Activity   Alcohol use: Yes   Drug use: No   Sexual activity: Yes    Birth control/protection: None  Other Topics Concern   Not on file  Social History Narrative   Not on file   Social Determinants of Health   Financial Resource Strain: Not on file  Food Insecurity: No Food Insecurity (02/22/2022)   Hunger Vital Sign    Worried About Running Out of Food in the Last Year: Never true    Ran Out of Food in the Last Year: Never true  Transportation Needs: Unknown (02/22/2022)   PRAPARE - Administrator, Civil Service (Medical): No    Lack of Transportation (Non-Medical): Not on  file  Physical Activity: Not on file  Stress: Not on file  Social Connections: Not on file  Intimate Partner Violence: Not At Risk (02/22/2022)   Humiliation, Afraid, Rape, and Kick questionnaire    Fear of Current or Ex-Partner: No    Emotionally Abused: No    Physically Abused: No    Sexually Abused: No    No family history on file.  Current Outpatient Medications on File Prior to Visit  Medication Sig Dispense Refill   apixaban (ELIQUIS) 5 MG TABS tablet Take 2 tablets (10mg ) twice daily for 6 days, then 1 tablet (5mg ) twice daily 60 tablet 0   Biotin w/ Vitamins C & E (HAIR/SKIN/NAILS PO) Take 1 capsule by mouth daily.     cabergoline (DOSTINEX) 0.5 MG tablet Take 0.25 mg by mouth See admin instructions. 0.25 mg twice weekly on Monday, Thursday     metoprolol tartrate (LOPRESSOR) 25 MG tablet Take 0.5 tablets (12.5 mg total) by mouth 2 (two) times daily. 30 tablet 1   naproxen (NAPROSYN) 500 MG tablet Take 500 mg by mouth 2 (two) times daily as needed for pain.     ondansetron (ZOFRAN) 4 MG tablet Take 4 mg by mouth every 8 (eight) hours as needed for nausea/vomiting.     oxyCODONE-acetaminophen (PERCOCET/ROXICET) 5-325 MG tablet Take 1 tablet by mouth  every 4 (four) hours as needed for pain.     No current facility-administered medications on file prior to visit.    Allergies as of 04/17/2022   (No Known Allergies)     ROS:   General:  No weight loss, Fever, chills  HEENT: No recent headaches, no nasal bleeding, no visual changes, no sore throat  Neurologic: No dizziness, blackouts, seizures. No recent symptoms of stroke or mini- stroke. No recent episodes of slurred speech, or temporary blindness.  Cardiac: No recent episodes of chest pain/pressure, no shortness of breath at rest.  No shortness of breath with exertion.  Denies history of atrial fibrillation or irregular heartbeat  Vascular: No history of rest pain in feet.  No history of claudication.  No history of  non-healing ulcer, No history of DVT   Pulmonary: No home oxygen, no productive cough, no hemoptysis,  No asthma or wheezing  Musculoskeletal:  [ ]  Arthritis, [ ]  Low back pain,  [ ]  Joint pain  Hematologic:No history of hypercoagulable state.  No history of easy bleeding.  No history of anemia  Gastrointestinal: No hematochezia or melena,  No gastroesophageal reflux, no trouble swallowing  Urinary: [ ]  chronic Kidney disease, [ ]  on HD - [ ]  MWF or [ ]  TTHS, [ ]  Burning with urination, [ ]  Frequent urination, [ ]  Difficulty urinating;   Skin: No rashes  Psychological: No history of anxiety,  No history of depression  Physical Examination  There were no vitals filed for this visit.  There is no height or weight on file to calculate BMI.  General:  Alert and oriented, no acute distress HEENT: Normal Neck: No bruit or JVD Pulmonary: Clear to auscultation bilaterally Cardiac: Regular Rate and Rhythm without murmur Abdomen: Soft, non-tender, non-distended, no mass, no scars Skin: No rash Extremity Pulses:  2+ radial, brachial, femoral, dorsalis pedis, posterior tibial pulses bilaterally Musculoskeletal: No deformity or edema  Neurologic: Upper and lower extremity motor 5/5 and symmetric  DATA:  Assessment:  Plan:  PA-C [facility] [place] Office: [phone]  MD on call 

## 2022-05-16 ENCOUNTER — Emergency Department (HOSPITAL_BASED_OUTPATIENT_CLINIC_OR_DEPARTMENT_OTHER): Payer: Medicaid Other

## 2022-05-16 ENCOUNTER — Encounter (HOSPITAL_BASED_OUTPATIENT_CLINIC_OR_DEPARTMENT_OTHER): Payer: Self-pay

## 2022-05-16 ENCOUNTER — Emergency Department (HOSPITAL_BASED_OUTPATIENT_CLINIC_OR_DEPARTMENT_OTHER)
Admission: EM | Admit: 2022-05-16 | Discharge: 2022-05-16 | Disposition: A | Discharge status: Home / Self Care | Payer: Medicaid Other | Attending: Emergency Medicine | Admitting: Emergency Medicine

## 2022-05-16 ENCOUNTER — Other Ambulatory Visit: Payer: Self-pay

## 2022-05-16 DIAGNOSIS — W01198A Fall on same level from slipping, tripping and stumbling with subsequent striking against other object, initial encounter: Secondary | ICD-10-CM | POA: Insufficient documentation

## 2022-05-16 DIAGNOSIS — S0990XA Unspecified injury of head, initial encounter: Secondary | ICD-10-CM | POA: Diagnosis present

## 2022-05-16 DIAGNOSIS — Z7901 Long term (current) use of anticoagulants: Secondary | ICD-10-CM | POA: Diagnosis not present

## 2022-05-16 HISTORY — DX: Acute embolism and thrombosis of unspecified deep veins of unspecified lower extremity: I82.409

## 2022-05-16 NOTE — Discharge Instructions (Addendum)
CAT scan today was normal.  It is okay to take Tylenol as needed for headache.  If you start having severe headache, vision changes, nausea and vomiting return to the ER

## 2022-05-16 NOTE — ED Notes (Signed)
Discharge instructions reviewed with patient. Patient verbalizes understanding, no further questions at this time. Medications/prescriptions and follow up information provided. No acute distress noted at time of departure.  

## 2022-05-16 NOTE — ED Provider Notes (Signed)
[facility] EMERGENCY DEPARTMENT Provider Note   CSN: [id number] Arrival date & time: 05/16/22  2202     History  Chief Complaint  Patient presents with   Head Injury    Jody Henson is a 31 y.o. female.  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  She was cleaning and was bent over and when she stood up smacked the back of her head.  She denied any loss of consciousness but did have some mild throbbing pain and a headache last night.  She reports this morning it was less but still there a little bit and now is almost completely resolved.  She has not had any vision changes, neck pain, numbness or weakness in her arms or legs or difficulty walking.  The history is provided by the patient.  Head Injury      Home Medications Prior to Admission medications   Medication Sig Start Date End Date Taking? Authorizing Provider  apixaban (ELIQUIS) 5 MG TABS tablet Take 2 tablets (10mg ) twice daily for 6 days, then 1 tablet (5mg ) twice daily 02/23/22   , MD  Biotin w/ Vitamins C & E (HAIR/SKIN/NAILS PO) Take 1 capsule by mouth daily.    [provider]  cabergoline (DOSTINEX) 0.5 MG tablet Take 0.25 mg by mouth See admin instructions. 0.25 mg twice weekly on Monday, Thursday 01/20/22   [provider]  metoprolol tartrate (LOPRESSOR) 25 MG tablet Take 0.5 tablets (12.5 mg total) by mouth 2 (two) times daily. 02/23/22   01/22/22, MD  naproxen (NAPROSYN) 500 MG tablet Take 500 mg by mouth 2 (two) times daily as needed for pain. 12/31/21   [provider]  ondansetron (ZOFRAN) 4 MG tablet Take 4 mg by mouth every 8 (eight) hours as needed for nausea/vomiting. 01/10/22   [provider]  oxyCODONE-acetaminophen (PERCOCET/ROXICET) 5-325 MG tablet Take 1 tablet by mouth every 4 (four) hours  as needed for pain. 02/03/22   [provider]      Allergies    Patient has no known allergies.    Review of Systems   Review of Systems  Physical Exam Updated Vital Signs BP 114/73 (BP Location: Right Arm)   Pulse 76   Temp 98.3 F (36.8 C) (Oral)   Resp 16   Ht 5\' 6"  (1.676 m)   Wt 106.1 kg   LMP 04/26/2022 (Exact Date)   SpO2 99%   BMI 37.77 kg/m  Physical Exam Vitals and nursing note reviewed.  Constitutional:      General: She is not in acute distress.    Appearance: She is well-developed.  HENT:     Head: Normocephalic and atraumatic.  Eyes:     Pupils: Pupils are equal, round, and reactive to light.  Cardiovascular:     Rate and Rhythm: Normal rate and regular rhythm.     Heart sounds: Normal heart sounds. No murmur heard.    No friction rub.  Pulmonary:     Effort: Pulmonary effort is normal.     Breath sounds: Normal breath sounds. No wheezing or rales.  Musculoskeletal:     Comments: No edema  Skin:    General: Skin is warm and dry.     Findings: No rash.  Neurological:     Mental Status: She is alert and oriented to  person, place, and time.     Cranial Nerves: No cranial nerve deficit.     Sensory: No sensory deficit.     Motor: No weakness.     Gait: Gait normal.  Psychiatric:        Behavior: Behavior normal.     ED Results / Procedures / Treatments   Labs (all labs ordered are listed, but only abnormal results are displayed) Labs Reviewed - No data to display  EKG None  Radiology CT Head Wo Contrast  Result Date: 05/16/2022 CLINICAL DATA:  Head trauma, minor, normal mental status (Age 80-64y) EXAM: CT HEAD WITHOUT CONTRAST TECHNIQUE: Contiguous axial images were obtained from the base of the skull through the vertex without intravenous contrast. RADIATION DOSE REDUCTION: This exam was performed according to the departmental dose-optimization program which includes automated exposure control, adjustment of the mA and/or kV  according to patient size and/or use of iterative reconstruction technique. COMPARISON:  09/27/2020 FINDINGS: Brain: No evidence of acute infarction, hemorrhage, hydrocephalus, extra-axial collection or mass lesion/mass effect. Vascular: No hyperdense vessel or unexpected calcification. Skull: Normal. Negative for fracture or focal lesion. Sinuses/Orbits: No acute finding. IMPRESSION: No acute intracranial process. Electronically Signed   By: Layla Maw M.D.   On: 05/16/2022 09:44    Procedures Procedures    Medications Ordered in ED Medications - No data to display  ED Course/ Medical Decision Making/ A&P                           Medical Decision Making Amount and/or Complexity of Data Reviewed Radiology: ordered and independent interpretation performed. Decision-making details documented in ED Course.   Pt with multiple medical problems and comorbidities and presenting today with a complaint that caries a high risk for morbidity and mortality.  Patient had a head injury yesterday and is on Eliquis.  She hit the back of her head and was having a headache but now that is almost completely resolved.  Neurologically intact, normal blood pressure. I have independently visualized and interpreted pt's images today.  Head CT negative for intracranial hemorrhage today.  Findings discussed with the patient.  This time she is cleared for discharge home.          Final Clinical Impression(s) / ED Diagnoses Final diagnoses:  Injury of head, initial encounter    Rx / DC Orders ED Discharge Orders     None         Gwyneth Sprout, MD 05/16/22 1002

## 2022-05-16 NOTE — ED Triage Notes (Signed)
States fell yesterday and hit posterior head on bedframe, denies LOC. On eliquis. States has had intermittent headache, denies other symptoms.

## 2022-05-18 ENCOUNTER — Other Ambulatory Visit: Payer: Self-pay | Admitting: *Deleted

## 2022-05-18 DIAGNOSIS — I82422 Acute embolism and thrombosis of left iliac vein: Secondary | ICD-10-CM

## 2022-05-31 ENCOUNTER — Ambulatory Visit (INDEPENDENT_AMBULATORY_CARE_PROVIDER_SITE_OTHER)
Admission: RE | Admit: 2022-05-31 | Discharge: 2022-05-31 | Disposition: A | Discharge status: Home / Self Care | Payer: Medicaid Other | Source: Ambulatory Visit | Attending: Vascular Surgery | Admitting: Vascular Surgery

## 2022-05-31 ENCOUNTER — Ambulatory Visit (INDEPENDENT_AMBULATORY_CARE_PROVIDER_SITE_OTHER): Payer: Medicaid Other | Admitting: Vascular Surgery

## 2022-05-31 ENCOUNTER — Ambulatory Visit (HOSPITAL_COMMUNITY)
Admission: RE | Admit: 2022-05-31 | Discharge: 2022-05-31 | Disposition: A | Discharge status: Home / Self Care | Payer: Medicaid Other | Source: Ambulatory Visit | Attending: Vascular Surgery | Admitting: Vascular Surgery

## 2022-05-31 ENCOUNTER — Other Ambulatory Visit: Payer: Self-pay | Admitting: Vascular Surgery

## 2022-05-31 ENCOUNTER — Encounter: Payer: Self-pay | Admitting: Vascular Surgery

## 2022-05-31 VITALS — BP 100/65 | HR 89 | Temp 98.5°F | Resp 14 | Ht 66.0 in | Wt 238.0 lb

## 2022-05-31 DIAGNOSIS — I82422 Acute embolism and thrombosis of left iliac vein: Secondary | ICD-10-CM

## 2022-05-31 NOTE — Progress Notes (Signed)
REASON FOR VISIT:   Follow-up after mechanical thrombectomy of left lower extremity DVT.  MEDICAL ISSUES:   S/P MECHANICAL THROMBECTOMY LEFT LOWER EXTREMITY: This patient underwent successful mechanical thrombectomy of the DVT involving the left common and external iliac vein.  She has been on Eliquis for 3 months now.  Her venous duplex scan today shows no evidence of thrombus in the left common iliac, external iliac, or left lower extremity veins.  This was a provoked DVT that happened after ankle surgery.  She can discontinue her Eliquis.  We have discussed the importance of daily leg elevation and the proper positioning for this.  I encouraged her to wear knee-high compression stockings she will be standing or sitting a lot.  We discussed the importance of exercise.  I encouraged her to avoid prolonged sitting and standing.  We also discussed importance maintaining healthy weight.  If she develops recurrent swelling in the future she would need a follow-up duplex scan given that she is at slightly increased risk for DVT in the future.    HPI:   Jody Henson is a pleasant 32 y.o. female who had presented with an iliofemoral DVT back in September of last year.  She had recent ankle surgery and presented to the hospital with left lower extremity swelling.  Duplex demonstrated DVT involving the iliac vein and femoral vein on the left.  She was taken to the [facility] lab by Dr. Virl Cagey on 02/22/2022.  She underwent mechanical thrombectomy and balloon angioplasty of the left common and external iliac vein.  She also underwent venoplasty of the left external and common iliac veins with a 12 mm in diameter balloon.  She was set up for a 24-month follow-up visit.  She has been maintained on Eliquis.  On my history, the patient continues to have some bilateral lower extremity swelling.  She has been back at work and does a fair amount of sitting and standing.  She has been wearing her compression stockings  which does help.  She denies any chest pain or shortness of breath.  Past Medical History:  Diagnosis Date   DVT (deep venous thrombosis) ([place])    Hypertension affecting pregnancy     History reviewed. No pertinent family history.  SOCIAL HISTORY: Social History   Tobacco Use   Smoking status: Former    Types: Cigars   Smokeless tobacco: Never  Substance Use Topics   Alcohol use: Yes    No Known Allergies  Current Outpatient Medications  Medication Sig Dispense Refill   apixaban (ELIQUIS) 5 MG TABS tablet Take 2 tablets (10mg ) twice daily for 6 days, then 1 tablet (5mg ) twice daily 60 tablet 0   metoprolol tartrate (LOPRESSOR) 25 MG tablet Take 0.5 tablets (12.5 mg total) by mouth 2 (two) times daily. 30 tablet 1   Biotin w/ Vitamins C & E (HAIR/SKIN/NAILS PO) Take 1 capsule by mouth daily. (Patient not taking: Reported on 05/31/2022)     cabergoline (DOSTINEX) 0.5 MG tablet Take 0.25 mg by mouth See admin instructions. 0.25 mg twice weekly on Monday, Thursday (Patient not taking: Reported on 05/31/2022)     naproxen (NAPROSYN) 500 MG tablet Take 500 mg by mouth 2 (two) times daily as needed for pain. (Patient not taking: Reported on 05/31/2022)     ondansetron (ZOFRAN) 4 MG tablet Take 4 mg by mouth every 8 (eight) hours as needed for nausea/vomiting. (Patient not taking: Reported on 05/31/2022)     oxyCODONE-acetaminophen (PERCOCET/ROXICET) 5-325 MG tablet  Take 1 tablet by mouth every 4 (four) hours as needed for pain. (Patient not taking: Reported on 05/31/2022)     No current facility-administered medications for this visit.    REVIEW OF SYSTEMS:  [X]  denotes positive finding, [ ]  denotes negative finding Cardiac  Comments:  Chest pain or chest pressure:    Shortness of breath upon exertion:    Short of breath when lying flat:    Irregular heart rhythm:        Vascular    Pain in calf, thigh, or hip brought on by ambulation:    Pain in feet at night that wakes you up from your  sleep:     Blood clot in your veins:    Leg swelling:  x       Pulmonary    Oxygen at home:    Productive cough:     Wheezing:         Neurologic    Sudden weakness in arms or legs:     Sudden numbness in arms or legs:     Sudden onset of difficulty speaking or slurred speech:    Temporary loss of vision in one eye:     Problems with dizziness:         Gastrointestinal    Blood in stool:     Vomited blood:         Genitourinary    Burning when urinating:     Blood in urine:        Psychiatric    Major depression:         Hematologic    Bleeding problems:    Problems with blood clotting too easily:        Skin    Rashes or ulcers:        Constitutional    Fever or chills:     PHYSICAL EXAM:   Vitals:   05/31/22 1315  BP: 100/65  Pulse: 89  Resp: 14  Temp: 98.5 F (36.9 C)  TempSrc: Temporal  SpO2: 99%  Weight: 238 lb (108 kg)  Height: 5\' 6"  (1.676 m)    GENERAL: The patient is a well-nourished female, in no acute distress. The vital signs are documented above. CARDIAC: There is a regular rate and rhythm.  VASCULAR: She has mild bilateral lower extremity swelling. On the right side she has a biphasic anterior tibial and posterior tibial signal with the Doppler. On the left side she has a biphasic dorsalis pedis and posterior tibial signal with the Doppler. PULMONARY: There is good air exchange bilaterally without wheezing or rales. ABDOMEN: Soft and non-tender with normal pitched bowel sounds.  MUSCULOSKELETAL: There are no major deformities or cyanosis. NEUROLOGIC: No focal weakness or paresthesias are detected. SKIN: There are no ulcers or rashes noted. PSYCHIATRIC: The patient has a normal affect.  DATA:    IVC ILIAC DUPLEX: I have independently interpreted her IVC iliac duplex today.  There is no evidence of DVT involving the left iliac system.  LEFT LOWER EXTREMITY VENOUS DUPLEX STUDY: I have independently interpreted the patient's left lower  extremity venous duplex study.  There is no evidence of DVT.  Deitra Mayo [facility] [phone]

## 2022-07-04 ENCOUNTER — Encounter: Payer: Self-pay | Admitting: Vascular Surgery

## 2022-07-14 IMAGING — CR DG CHEST 2V
2 series · 2 of 2 positions shown · non-contrast
Comparison: None.

CLINICAL DATA: C/o mid chest tightness off and on since this
morning. Smoker

EXAM:
CHEST - 2 VIEW

[w chest pa]
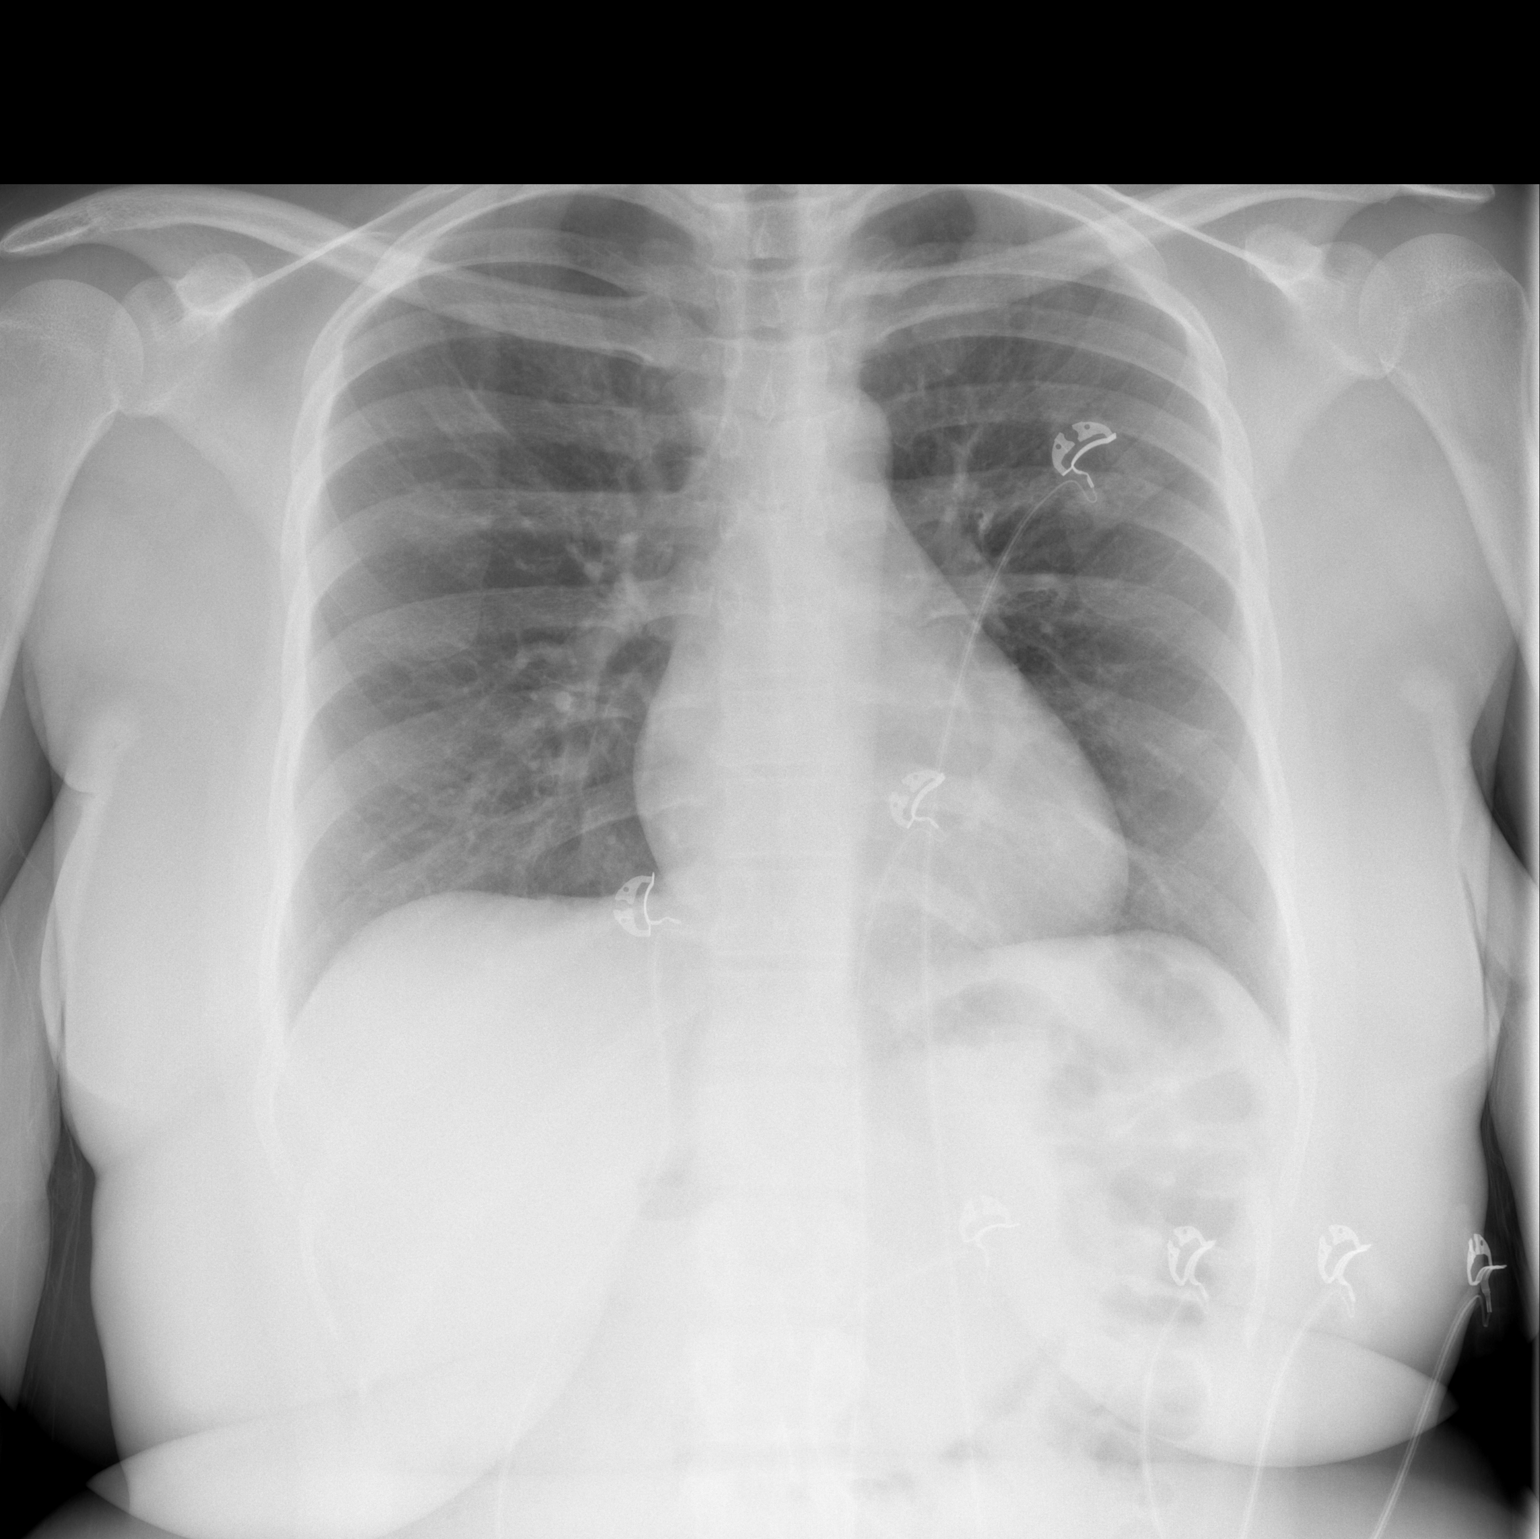

[w chest lat]
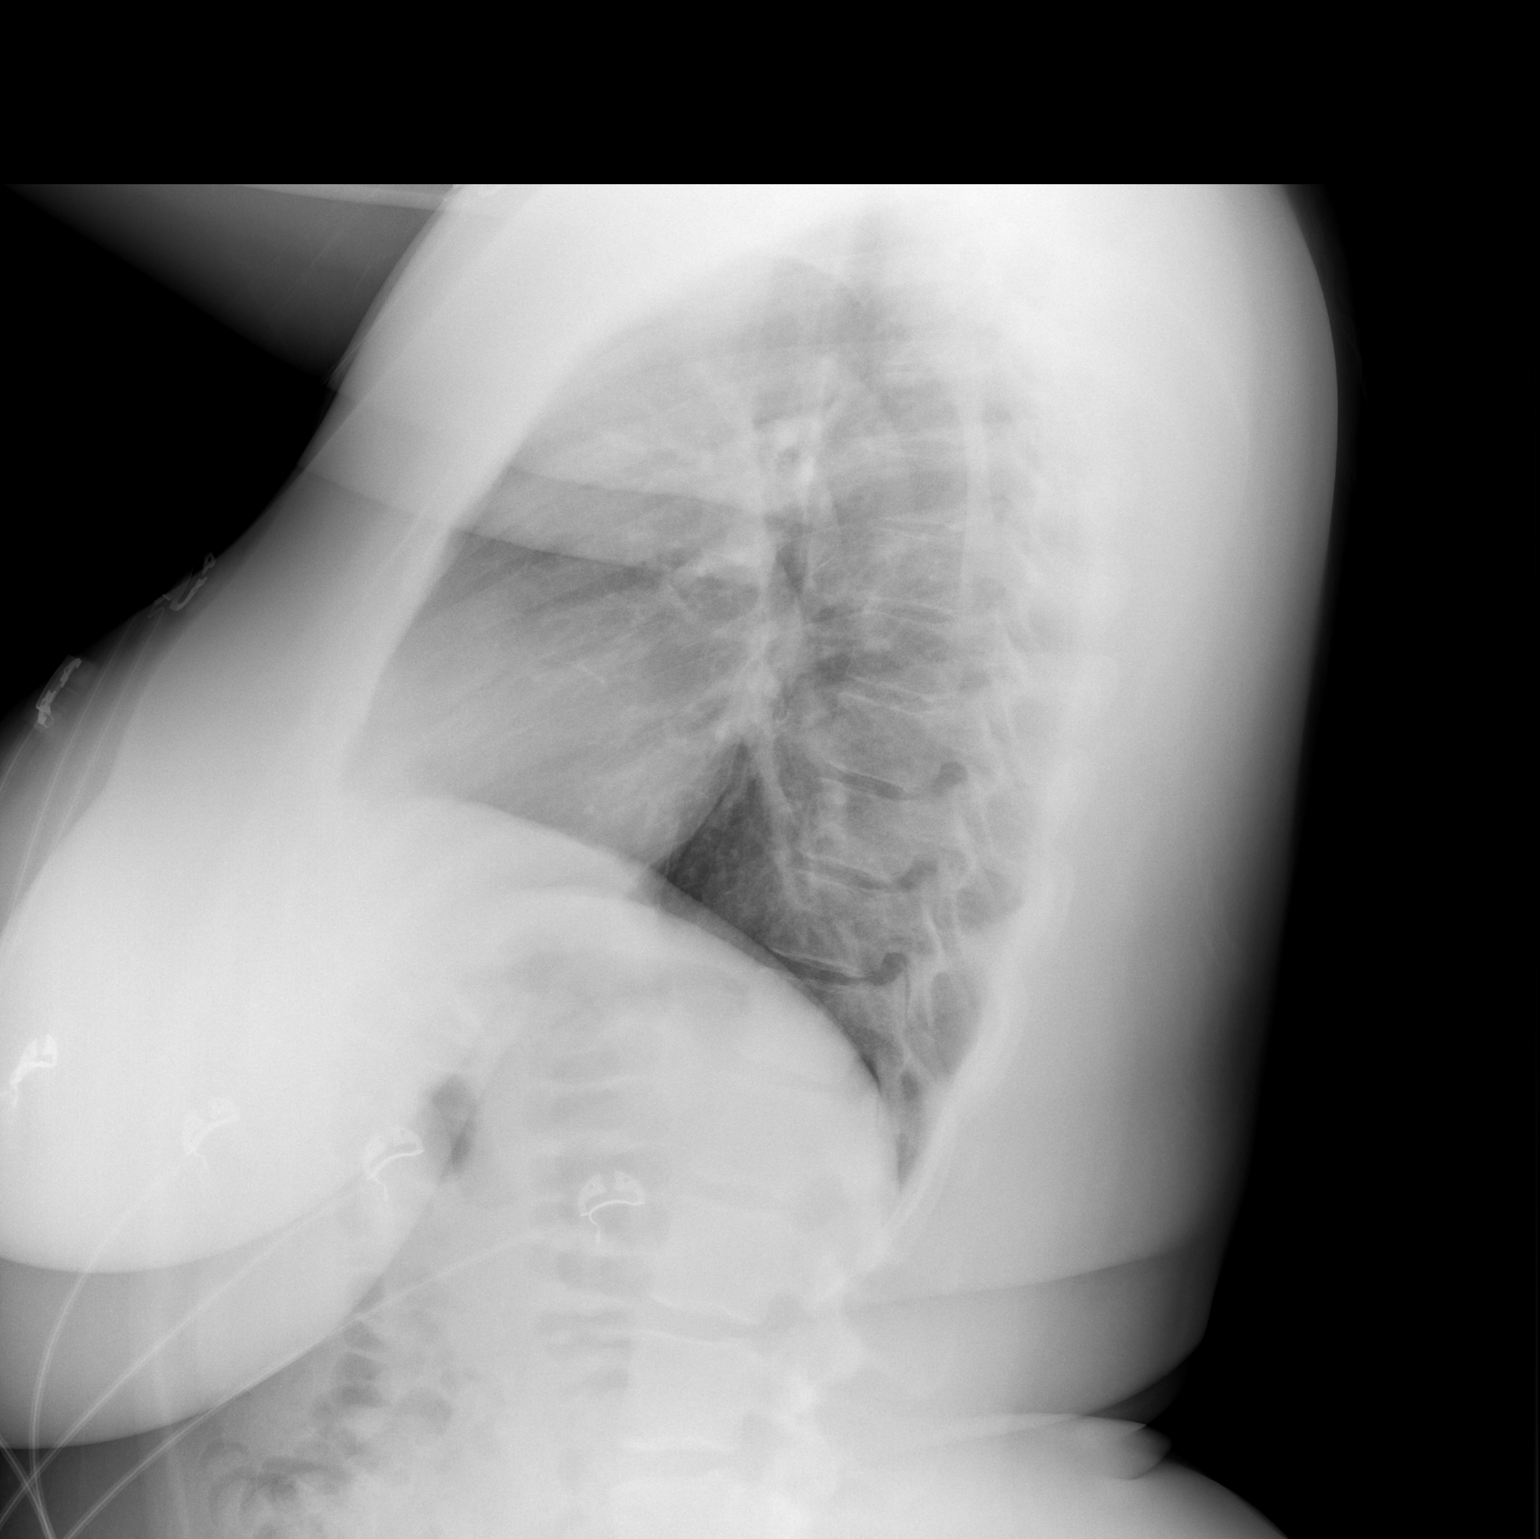

[2 of 2 positions shown; findings below may reference images not displayed]

FINDINGS: The heart size and mediastinal contours are within normal limits.
The lungs are clear. No pneumothorax or pleural effusion. The
visualized skeletal structures are unremarkable.
IMPRESSION: No active cardiopulmonary disease.

## 2024-02-19 ENCOUNTER — Encounter (HOSPITAL_BASED_OUTPATIENT_CLINIC_OR_DEPARTMENT_OTHER): Payer: Self-pay | Admitting: Urology

## 2024-02-19 ENCOUNTER — Other Ambulatory Visit: Payer: Self-pay

## 2024-02-19 ENCOUNTER — Emergency Department (HOSPITAL_BASED_OUTPATIENT_CLINIC_OR_DEPARTMENT_OTHER)
Admission: EM | Admit: 2024-02-19 | Discharge: 2024-02-19 | Disposition: A | Discharge status: Home / Self Care | Attending: Emergency Medicine | Admitting: Emergency Medicine

## 2024-02-19 DIAGNOSIS — M79605 Pain in left leg: Secondary | ICD-10-CM | POA: Insufficient documentation

## 2024-02-19 DIAGNOSIS — Z79899 Other long term (current) drug therapy: Secondary | ICD-10-CM | POA: Insufficient documentation

## 2024-02-19 DIAGNOSIS — M79604 Pain in right leg: Secondary | ICD-10-CM | POA: Insufficient documentation

## 2024-02-19 DIAGNOSIS — I10 Essential (primary) hypertension: Secondary | ICD-10-CM | POA: Diagnosis not present

## 2024-02-19 DIAGNOSIS — Z7901 Long term (current) use of anticoagulants: Secondary | ICD-10-CM | POA: Insufficient documentation

## 2024-02-19 LAB — CBC
HCT: 37.8 % (ref 36.0–46.0)
Hemoglobin: 12.8 g/dL (ref 12.0–15.0)
MCH: 29.4 pg (ref 26.0–34.0)
MCHC: 33.9 g/dL (ref 30.0–36.0)
MCV: 86.7 fL (ref 80.0–100.0)
Platelets: 263 K/uL (ref 150–400)
RBC: 4.36 MIL/uL (ref 3.87–5.11)
RDW: 13.2 % (ref 11.5–15.5)
WBC: 6 K/uL (ref 4.0–10.5)
nRBC: 0 % (ref 0.0–0.2)

## 2024-02-19 LAB — BASIC METABOLIC PANEL WITH GFR
Anion gap: 11 (ref 5–15)
BUN: 9 mg/dL (ref 6–20)
CO2: 22 mmol/L (ref 22–32)
Calcium: 8.7 mg/dL — ABNORMAL LOW (ref 8.9–10.3)
Chloride: 104 mmol/L (ref 98–111)
Creatinine, Ser: 0.6 mg/dL (ref 0.44–1.00)
GFR, Estimated: >60 mL/min (ref >60)
Glucose, Bld: 108 mg/dL — ABNORMAL HIGH (ref 70–99)
Potassium: 3.6 mmol/L (ref 3.5–5.1)
Sodium: 137 mmol/L (ref 135–145)

## 2024-02-19 MED ORDER — PREDNISONE 50 MG PO TABS
60.0000 mg | ORAL_TABLET | Freq: Once | ORAL | Status: AC
  Administered 2024-02-19: 60 mg via ORAL
  Filled 2024-02-19: qty 1

## 2024-02-19 MED ORDER — ENOXAPARIN SODIUM 100 MG/ML IJ SOSY
100.0000 mg | PREFILLED_SYRINGE | Freq: Once | INTRAMUSCULAR | Status: AC
Start: 2024-02-19 — End: 2024-02-19
  Administered 2024-02-19: 100 mg via SUBCUTANEOUS
  Filled 2024-02-19: qty 1

## 2024-02-19 NOTE — ED Triage Notes (Signed)
 Pt states bilateral leg pain for approx 10 days that started at the thighs and radiate down to the feet  Gets worse when laying down    H/o lyphedema in right leg

## 2024-02-19 NOTE — Discharge Instructions (Addendum)
 As discussed, recommend follow up tomorrow for your planned ultrasound for assessment of possible blood clot in your leg with your primary care. You have been treated with lovenox  here for a possible DVT. Your labs showed slightly low calcium but this should not be causing your symptoms.  Otherwise, labs appear normal.

## 2024-02-19 NOTE — ED Provider Notes (Signed)
 [facility] [facility]ERGENCY DEPARTMENT AT [facility] Provider Note   CSN: [id number] Arrival date & time: 02/19/24  2214     Patient presents with: Leg Pain   Jody Henson is a 33 y.o. female.  {Add pertinent medical, surgical, social history, OB history to HPI:32947}  Leg Pain   33 year old female presents emergency department with complaints of bilateral leg pain.  States that she has been having symptoms for the past 10 days or so.  Reports pain in upper thigh that radiates down to feet.  Reports a tingling type sensation as well.  This is worsened with certain positions such as lying flat.  Denies any weakness in lower extremities, saddle anesthesia, bowel/bladder dysfunction, back pain, fever, history of IV drug use.  Was seen outpatient provider they were concerned about DVT and have ultrasound performed for tomorrow.  Patient does report history of DVT after a procedure performed on her left ankle.  Not currently on anticoagulation.  Denies any chest pain, shortness of breath.  Does report history of lymphedema right greater than left lower extremity.  Past medical history significant for DVT, PE, hypertension  Prior to Admission medications   Medication Sig Start Date End Date Taking? Authorizing Provider  apixaban  (ELIQUIS ) 5 MG TABS tablet Take 2 tablets (10mg ) twice daily for 6 days, then 1 tablet (5mg ) twice daily 02/23/22   Gherghe, Costin M, MD  Biotin w/ Vitamins C & E (HAIR/SKIN/NAILS PO) Take 1 capsule by mouth daily. Patient not taking: Reported on 05/31/2022    [provider]  cabergoline (DOSTINEX) 0.5 MG tablet Take 0.25 mg by mouth See admin instructions. 0.25 mg twice weekly on Monday, Thursday Patient not taking: Reported on 05/31/2022 01/20/22   [provider]  metoprolol  tartrate (LOPRESSOR ) 25 MG tablet Take 0.5 tablets (12.5 mg total) by mouth 2 (two) times daily. 02/23/22   Gherghe, Costin M, MD  naproxen (NAPROSYN) 500 MG tablet Take 500  mg by mouth 2 (two) times daily as needed for pain. Patient not taking: Reported on 05/31/2022 12/31/21   [provider]  ondansetron  (ZOFRAN ) 4 MG tablet Take 4 mg by mouth every 8 (eight) hours as needed for nausea/vomiting. Patient not taking: Reported on 05/31/2022 01/10/22   [provider]  oxyCODONE -acetaminophen  (PERCOCET/ROXICET) 5-325 MG tablet Take 1 tablet by mouth every 4 (four) hours as needed for pain. Patient not taking: Reported on 05/31/2022 02/03/22   [provider]    Allergies: Patient has no known allergies.    Review of Systems  All other systems reviewed and are negative.   Updated Vital Signs There were no vitals taken for this visit.  Physical Exam Vitals and nursing note reviewed.  Constitutional:      General: She is not in acute distress.    Appearance: She is well-developed.  HENT:     Head: Normocephalic and atraumatic.  Eyes:     Conjunctiva/sclera: Conjunctivae normal.  Cardiovascular:     Rate and Rhythm: Normal rate and regular rhythm.     Heart sounds: No murmur heard. Pulmonary:     Effort: Pulmonary effort is normal. No respiratory distress.     Breath sounds: Normal breath sounds.  Abdominal:     Palpations: Abdomen is soft.     Tenderness: There is no abdominal tenderness.  Musculoskeletal:        General: No swelling.     Cervical back: Neck supple.     Comments: Full range of motion bilateral lower  extremities.  No obvious tenderness lumbar spine or paraspinal and lumbar region.  DTR symmetric at the patella as well as Achilles.  No real sensory deficits along major nerve distributions of lower extremities.  Pedal and posterior tibial pulses 2+ bilaterally.  Skin:    General: Skin is warm and dry.     Capillary Refill: Capillary refill takes less than 2 seconds.  Neurological:     Mental Status: She is alert.  Psychiatric:        Mood and Affect: Mood normal.     (all labs ordered are listed, but only  abnormal results are displayed) Labs Reviewed - No data to display  EKG: None  Radiology: No results found.  {Document cardiac monitor, telemetry assessment procedure when appropriate:32947} Procedures   Medications Ordered in the ED - No data to display    {Click here for ABCD2, HEART and other calculators REFRESH Note before signing:1}                              Medical Decision Making Amount and/or Complexity of Data Reviewed Labs: ordered.  Risk Prescription drug management.   This patient presents to the ED for concern of leg pain, this involves an extensive number of treatment options, and is a complaint that carries with it a high risk of complications and morbidity.  The differential diagnosis includes DVT, ischemic limb, fracture, dislocation, cellulitis, compartment syndrome, other   Co morbidities that complicate the patient evaluation  See HPI   Additional history obtained:  Additional history obtained from EMR External records from outside source obtained and reviewed including hospital records   Lab Tests:  No leukocytosis, no evidence of anemia, platelets within normal range. ***   Imaging Studies ordered:  N/a   Cardiac Monitoring: / EKG:  N/a   Consultations Obtained:  N/a   Problem List / ED Course / Critical interventions / Medication management  Leg pain I ordered medication including prednisone , lovenox     Reevaluation of the patient after these medicines showed that the patient stayed the same I have reviewed the patients home medicines and have made adjustments as needed   Social Determinants of Health:  Former cigar use.  Denies illicit drug use.   Test / Admission - Considered:  Leg pain Vitals signs  within normal range and stable throughout visit. Laboratory studies significant for: See above 33 year old female presents emergency department with complaints of bilateral leg pain.  States that she has been having  symptoms for the past 10 days or so.  Reports pain in upper thigh that radiates down to feet.  Reports a tingling type sensation as well.  This is worsened with certain positions such as lying flat.  Denies any weakness in lower extremities, saddle anesthesia, bowel/bladder dysfunction, back pain, fever, history of IV drug use.  Was seen outpatient provider they were concerned about DVT and have ultrasound performed for tomorrow.  Patient does report history of DVT after a procedure performed on her left ankle.  Not currently on anticoagulation.  Denies any chest pain, shortness of breath.  Does report history of lymphedema right greater than left lower extremity. *** Worrisome signs and symptoms were discussed with the patient, and the patient acknowledged understanding to return to the ED if noticed. Patient was stable upon discharge.    {Document critical care time when appropriate  Document review of labs and clinical decision tools ie CHADS2VASC2, etc  Document  your independent review of radiology images and any outside records  Document your discussion with family members, caretakers and with consultants  Document social determinants of health affecting pt's care  Document your decision making why or why not admission, treatments were needed:32947:::1}   Final diagnoses:  None    ED Discharge Orders     None
